# Patient Record
Sex: Male | Born: 1940 | Race: White | Hispanic: No | Marital: Married | Smoking: Former smoker
Health system: Southern US, Community
[De-identification: ages and names within clinical notes are randomized; demographics above are authoritative.]

## PROBLEM LIST (undated history)

## (undated) DIAGNOSIS — N529 Male erectile dysfunction, unspecified: Secondary | ICD-10-CM

## (undated) DIAGNOSIS — E785 Hyperlipidemia, unspecified: Secondary | ICD-10-CM

## (undated) DIAGNOSIS — Z8679 Personal history of other diseases of the circulatory system: Secondary | ICD-10-CM

## (undated) DIAGNOSIS — R7303 Prediabetes: Secondary | ICD-10-CM

## (undated) DIAGNOSIS — Z86718 Personal history of other venous thrombosis and embolism: Secondary | ICD-10-CM

## (undated) DIAGNOSIS — C61 Malignant neoplasm of prostate: Secondary | ICD-10-CM

## (undated) DIAGNOSIS — R300 Dysuria: Secondary | ICD-10-CM

## (undated) DIAGNOSIS — N210 Calculus in bladder: Secondary | ICD-10-CM

## (undated) DIAGNOSIS — R911 Solitary pulmonary nodule: Secondary | ICD-10-CM

## (undated) DIAGNOSIS — Z87448 Personal history of other diseases of urinary system: Secondary | ICD-10-CM

## (undated) DIAGNOSIS — Z860100 Personal history of colon polyps, unspecified: Secondary | ICD-10-CM

## (undated) DIAGNOSIS — Z86711 Personal history of pulmonary embolism: Secondary | ICD-10-CM

## (undated) DIAGNOSIS — Z974 Presence of external hearing-aid: Secondary | ICD-10-CM

## (undated) DIAGNOSIS — Z923 Personal history of irradiation: Secondary | ICD-10-CM

## (undated) DIAGNOSIS — N400 Enlarged prostate without lower urinary tract symptoms: Secondary | ICD-10-CM

## (undated) DIAGNOSIS — Z8719 Personal history of other diseases of the digestive system: Secondary | ICD-10-CM

## (undated) DIAGNOSIS — R351 Nocturia: Secondary | ICD-10-CM

## (undated) DIAGNOSIS — I10 Essential (primary) hypertension: Secondary | ICD-10-CM

## (undated) DIAGNOSIS — N4289 Other specified disorders of prostate: Secondary | ICD-10-CM

## (undated) HISTORY — PX: HERNIA REPAIR: SHX51

## (undated) HISTORY — DX: Benign prostatic hyperplasia without lower urinary tract symptoms: N40.0

## (undated) HISTORY — DX: Male erectile dysfunction, unspecified: N52.9

## (undated) HISTORY — PX: EYE SURGERY: SHX253

## (undated) HISTORY — DX: Prediabetes: R73.03

## (undated) HISTORY — PX: COLONOSCOPY: SHX174

## (undated) HISTORY — PX: CATARACT EXTRACTION W/ INTRAOCULAR LENS  IMPLANT, BILATERAL: SHX1307

---
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
## 1998-05-12 ENCOUNTER — Ambulatory Visit (HOSPITAL_COMMUNITY): Admission: RE | Admit: 1998-05-12 | Discharge: 1998-05-12 | Payer: Self-pay | Admitting: Family Medicine

## 2009-03-12 DIAGNOSIS — Z8719 Personal history of other diseases of the digestive system: Secondary | ICD-10-CM

## 2009-03-12 HISTORY — DX: Personal history of other diseases of the digestive system: Z87.19

## 2009-03-21 ENCOUNTER — Ambulatory Visit (HOSPITAL_COMMUNITY): Admission: RE | Admit: 2009-03-21 | Discharge: 2009-03-21 | Payer: Self-pay | Admitting: General Surgery

## 2009-03-21 HISTORY — PX: OTHER SURGICAL HISTORY: SHX169

## 2009-03-22 ENCOUNTER — Emergency Department (HOSPITAL_COMMUNITY): Admission: EM | Admit: 2009-03-22 | Discharge: 2009-03-22 | Payer: Self-pay | Admitting: Emergency Medicine

## 2009-05-09 ENCOUNTER — Ambulatory Visit (HOSPITAL_BASED_OUTPATIENT_CLINIC_OR_DEPARTMENT_OTHER): Admission: RE | Admit: 2009-05-09 | Discharge: 2009-05-10 | Payer: Self-pay | Admitting: Urology

## 2009-05-09 HISTORY — PX: TRANSURETHRAL RESECTION OF PROSTATE: SHX73

## 2009-11-17 ENCOUNTER — Encounter: Admission: RE | Admit: 2009-11-17 | Discharge: 2009-11-17 | Payer: Self-pay | Admitting: General Surgery

## 2009-12-26 ENCOUNTER — Ambulatory Visit (HOSPITAL_COMMUNITY): Admission: RE | Admit: 2009-12-26 | Discharge: 2009-12-28 | Payer: Self-pay | Admitting: General Surgery

## 2009-12-26 HISTORY — PX: OTHER SURGICAL HISTORY: SHX169

## 2009-12-31 ENCOUNTER — Ambulatory Visit: Payer: Self-pay | Admitting: Internal Medicine

## 2009-12-31 ENCOUNTER — Inpatient Hospital Stay (HOSPITAL_COMMUNITY): Admission: EM | Admit: 2009-12-31 | Discharge: 2010-01-10 | Payer: Self-pay | Admitting: General Surgery

## 2010-01-05 ENCOUNTER — Ambulatory Visit: Payer: Self-pay | Admitting: Vascular Surgery

## 2010-01-05 ENCOUNTER — Encounter (INDEPENDENT_AMBULATORY_CARE_PROVIDER_SITE_OTHER): Payer: Self-pay | Admitting: Surgery

## 2010-01-05 HISTORY — PX: TRANSTHORACIC ECHOCARDIOGRAM: SHX275

## 2010-01-12 ENCOUNTER — Ambulatory Visit: Payer: Self-pay | Admitting: Internal Medicine

## 2010-01-12 LAB — CONVERTED CEMR LAB

## 2010-01-19 ENCOUNTER — Ambulatory Visit: Payer: Self-pay | Admitting: Cardiology

## 2010-01-19 LAB — CONVERTED CEMR LAB: POC INR: 5.1

## 2010-01-25 ENCOUNTER — Encounter: Payer: Self-pay | Admitting: Internal Medicine

## 2010-01-26 ENCOUNTER — Ambulatory Visit: Payer: Self-pay | Admitting: Internal Medicine

## 2010-01-26 DIAGNOSIS — I2699 Other pulmonary embolism without acute cor pulmonale: Secondary | ICD-10-CM

## 2010-01-26 DIAGNOSIS — I10 Essential (primary) hypertension: Secondary | ICD-10-CM | POA: Insufficient documentation

## 2010-01-27 ENCOUNTER — Ambulatory Visit: Payer: Self-pay | Admitting: Cardiology

## 2010-01-27 LAB — CONVERTED CEMR LAB: POC INR: 3.3

## 2010-02-03 ENCOUNTER — Ambulatory Visit: Payer: Self-pay | Admitting: Cardiology

## 2010-02-03 LAB — CONVERTED CEMR LAB: POC INR: 3.1

## 2010-04-11 NOTE — Medication Information (Signed)
Summary: rov/nb  Anticoagulant Therapy  Managed by: Lyna Poser, PharmD Referring MD: Sandria Manly PCP: Dr. Sigmund Hazel, Salley Scarlet Supervising MD: Antoine Poche MD, Fayrene Fearing Indication 1: Pulmonary Embolism Indication 2: Atrial Fibrillation Lab Used: [facility] [place] Site: [address] INR POC 3.3 INR RANGE 2-3  Dietary changes: no    Health status changes: no    Bleeding/hemorrhagic complications: no    Recent/future hospitalizations: no    Any changes in medication regimen? no    Recent/future dental: no  Any missed doses?: no       Is patient compliant with meds? yes       Allergies: 1)  ! * Pseudophedrine  Anticoagulation Management History:      Positive risk factors for bleeding include an age of 71 years or older.  The bleeding index is 'intermediate risk'.  Positive CHADS2 values include History of HTN.  Negative CHADS2 values include Age > 7 years old.  Anticoagulation responsible provider: Antoine Poche MD, Fayrene Fearing.  INR POC: 3.3.  Exp: 01/2011.    Anticoagulation Management Assessment/Plan:      The patient's current anticoagulation dose is Coumadin 2.5 mg tabs: Take 3 tablets daily except on Tuesday and Saturday take 2 tablets, per coumadin clinic.  The target INR is 2.0-3.0.  The next INR is due 02/03/2010.  Results were reviewed/authorized by Lyna Poser, PharmD.         Prior Anticoagulation Instructions: INR 5.1 Hold dose today and tomorrow, take 3 tablets everyday except 2 tablets on Tuesday and Saturday. Recheck INR in 1 weeks.  Current Anticoagulation Instructions: INR 3.3 Skip your dose today. On tuesday, thursday, and saturday take 2 tablets. And take 3 tablets all other days. Recheck in 1 week.

## 2010-04-11 NOTE — Medication Information (Signed)
Summary: NEW TO COUMADIN/PE/AFIB  Anticoagulant Therapy  Managed by: Lyna Poser, PharmD Referring MD: Lucienne Minks MD: Daleen Squibb MD, Maisie Fus Indication 1: Pulmonary Embolism Indication 2: Atrial Fibrillation Lab Used: [facility] [place] Site: [address] INR POC 2.3 INR RANGE 2-3  Dietary changes: no    Health status changes: yes       Details: recently d/c with new PE and afib  Bleeding/hemorrhagic complications: no    Recent/future hospitalizations: yes       Details: recently d/c'd  Any changes in medication regimen? yes       Details: started amiodarone as well in hospital  Recent/future dental: no  Any missed doses?: no       Is patient compliant with meds? yes       Anticoagulation Management History:      The patient comes in today for his initial visit for anticoagulation therapy.  Positive risk factors for bleeding include an age of 62 years or older.  The bleeding index is 'intermediate risk'.  Negative CHADS2 values include Age > 8 years old.  Anticoagulation responsible provider: Daleen Squibb MD, Maisie Fus.  INR POC: 2.3.  Cuvette Lot#: [id number].    Anticoagulation Management Assessment/Plan:      The next INR is due 01/19/2010.  Results were reviewed/authorized by Lyna Poser, PharmD.         Current Anticoagulation Instructions: INR 2.3  Continue taking 3 tablets at bedtime. Recheck in 1 week.

## 2010-04-11 NOTE — Medication Information (Signed)
Summary: rov/mw  Anticoagulant Therapy  Managed by: Weston Brass, PharmD Referring MD: Lucienne Minks MD: Antoine Poche MD, Fayrene Fearing Indication 1: Pulmonary Embolism Indication 2: Atrial Fibrillation Lab Used: [facility] [place] Site: [address] INR POC 5.1 INR RANGE 2-3  Dietary changes: no    Health status changes: no    Bleeding/hemorrhagic complications: no    Recent/future hospitalizations: no    Any changes in medication regimen? no    Recent/future dental: no  Any missed doses?: no       Is patient compliant with meds? yes      Comments: Pt is new to coumadin and amiodarone; he is likely to need a reduced dose at next visit. Recommend continued f/u every week. Pt got 7.5 mg x4 while in the hospital, INR at discharge was 2. Pt recieved 7.5 mg from 11/2 to 11/10, INR was 5.1 on 11/10.   Anticoagulation Management History:      The patient is taking warfarin and comes in today for a routine follow up visit.  Positive risk factors for bleeding include an age of 71 years or older.  The bleeding index is 'intermediate risk'.  Negative CHADS2 values include Age > 41 years old.  Anticoagulation responsible Kazi Reppond: Antoine Poche MD, Fayrene Fearing.  INR POC: 5.1.  Cuvette Lot#: [id number].  Exp: 01/2011.    Anticoagulation Management Assessment/Plan:      The target INR is 2.0-3.0.  The next INR is due 01/27/2010.  Results were reviewed/authorized by Weston Brass, PharmD.  He was notified by Hoy Register, PharmD Candidate.         Prior Anticoagulation Instructions: INR 2.3  Continue taking 3 tablets at bedtime. Recheck in 1 week.   Current Anticoagulation Instructions: INR 5.1 Hold dose today and tomorrow, take 3 tablets everyday except 2 tablets on Tuesday and Saturday. Recheck INR in 1 weeks.

## 2010-04-11 NOTE — Assessment & Plan Note (Signed)
Summary: Robert Miller   Visit Type:  Hospital Follow-up Copy to:  Dr Violeta Gelinas Primary Provider/Referring Provider:  Dr. Sigmund Hazel, Salley Scarlet  CC:  HFU for PE.  History of Present Illness: January 26, 2010: 70 year old male. REmote limited smoker. Previously healthy other than high lipid being on gemfibrosil. Was admitted in [facility] for verntal hernia repair. Post discharge admited  for partial SBO 12/31/2009. One day prior to anticipated discharge and on 10/126/2011 developed PE with Right Posterior Tibial Vein DVT and related A Fib. Discharged 01/11/2010 on amiodarone and coumadin. Per Dr. Janee Morn patient needed very high doses of heparin for anticoagulation and felt that in retrospect standard dvt prophylaxis with subcutaneously heparin was not sufficient. Currently feels well and back to baseline. Denies dyspnea, cough, chest pain, fever, chills, nausea, vomit, diarrhea, edema,  Wants to come off amiodarone and coumadin asap.   Denies prior DVT or PE. Denies family hx of DVT. No other PE/DVT risk factors.   Preventive Screening-Counseling & Management  Alcohol-Tobacco     Smoking Status: quit     Packs/Day: 0.5     Year Started: 1960     Year Quit: 1967     Pack years: 3.5  Current Medications (verified): 1)  Gemfibrozil 600 Mg Tabs (Gemfibrozil) .... Take One Tablet By Mouth Twice A Day With Meals 2)  Amiodarone Hcl 200 Mg Tabs (Amiodarone Hcl) .... Take One Tablet By Mouth Twice A Day 3)  Coumadin 2.5 Mg Tabs (Warfarin Sodium) .... Take 3 Tablets Daily Except On Tuesday and Saturday Take 2 Tablets, Per Coumadin Clinic  Allergies (verified): 1)  ! * Pseudophedrine  Past History:  Past Medical History: Postoperative partial small-bowel obstruction. 2. Acute bilateral pulmonary emboli with Posterior Tibial Vein DVT 01/04/2010 post op 3. Atrial fibrillation with rapid ventricular response (resolved).  Past Surgical History: Appendectomy Hernia Repair  Family  History: Father-dies 52 of MI, heavy drinker, HTN Mother-dies 76 stroke, HTN Siste-r died 2 suddenly from aneurysm  Social History: Patient states former smoker.  Started in 1960, quit in 1967 1/2 ppd.  Married Retired wife disabled x at age 47 yearrs after cardiac arrest hypoxia a male friend helps him and her with medical needs Smoking Status:  quit Packs/Day:  0.5 Pack years:  3.5  Review of Systems  The patient denies shortness of breath with activity, shortness of breath at rest, productive cough, non-productive cough, coughing up blood, chest pain, irregular heartbeats, acid heartburn, indigestion, loss of appetite, weight change, abdominal pain, difficulty swallowing, sore throat, tooth/dental problems, headaches, nasal congestion/difficulty breathing through nose, sneezing, itching, ear ache, anxiety, depression, hand/feet swelling, joint stiffness or pain, rash, change in color of mucus, and fever.    Vital Signs:  Patient profile:   70 year old male Height:      72 inches Weight:      229.25 pounds BMI:     31.20 O2 Sat:      96 % on Room air Temp:     98.6 degrees F oral Pulse rate:   75 / minute BP sitting:   130 / 82  (right arm) Cuff size:   regular  Vitals Entered By: Carron Curie CMA (January 26, 2010 2:55 PM)  O2 Flow:  Room air CC: HFU for PE Comments Medications reviewed with patient Carron Curie CMA  January 26, 2010 2:59 PM Daytime phone number verified with patient.    Physical Exam  General:  well developed, well nourished, in no acute distress.  Overweight Head:  normocephalic and atraumatic Eyes:  PERRLA/EOM intact; conjunctiva and sclera clear Ears:  TMs intact and clear with normal canals Nose:  no deformity, discharge, inflammation, or lesions Mouth:  no deformity or lesions Neck:  no masses, thyromegaly, or abnormal cervical nodes Chest Wall:  no deformities noted Lungs:  clear bilaterally to auscultation and  percussion Heart:  regular rate and rhythm, S1, S2 without murmurs, rubs, gallops, or clicks Abdomen:  soft obese scar of surgery + one echhymoses from old heparin shot + non tender normal bowel sounds + Msk:  no deformity or scoliosis noted with normal posture Pulses:  pulses normal Extremities:  no clubbing, cyanosis, edema, or deformity noted Neurologic:  CN II-XII grossly intact with normal reflexes, coordination, muscle strength and tone Skin:  intact without lesions or rashes Cervical Nodes:  no significant adenopathy Axillary Nodes:  no significant adenopathy Psych:  alert and cooperative; normal mood and affect; normal attention span and concentration   CT of Chest  Procedure date:  01/04/2010  Findings:       Contrast:  100 ml intravenous Omnipaque-300    Comparison:  None    Findings:  This is a technically satisfactory study.    Pulmonary emboli are identified within both main pulmonary arteries   and multiple segmental arteries bilaterally.   Evidence of right heart strain is noted.    There is no evidence of thoracic aortic aneurysm/dissection.   Mild to moderate coronary artery calcifications are present.    Tiny bilateral pleural effusions are noted.   There is no evidence of pericardial effusion.    No enlarged lymph nodes are identified.    Scattered areas of airspace opacity and focal consolidation are   noted bilaterally, greatest in the lower lobes and compatible   atelectasis and infarct changes.   There is no evidence of pulmonary nodule or mass.    The visualized upper abdomen is within normal limits.    No acute or suspicious bony abnormalities are noted.    Review of the MIP images confirms the above findings.    IMPRESSION:   Bilateral pulmonary emboli with moderate to large clot burden and   evidence of right heart strain.    Tiny bilateral pleural effusions and bilateral pulmonary opacities   likely representing atelectasis and  infarct changes.    Coronary artery disease.    Read By:  Rosendo Gros,  M.D.  Impression & Recommendations:  Problem # 1:  PULMONARY EMBOLISM (ICD-415.19) Assessment Unchanged  happened in setting of post op admission. No other risk factors. Currently doing well  PLAN explained need to take coumadin minimum 3 months but optimal 6 months life long coumadin if recurs therefore prefer he take coumadin now for 6 months esp so becuae risk for recurrence is greatest first 2 years he needs to sort out with Dr. Garnette Scheuermann about length of coumadin and amio for A Fib which is clearly related to PE and he is now sinus post coumadin he needs lovenox for any trip >4-5h  any futyure hospitalizations needs lovenox for dvt proph and SCDS  His updated medication list for this problem includes:    Coumadin 2.5 Mg Tabs (Warfarin sodium) .Marland Kitchen... Take 3 tablets daily except on tuesday and saturday take 2 tablets, per coumadin clinic  Orders: Est. Patient Level III (04540)  Medications Added to Medication List This Visit: 1)  Coumadin 2.5 Mg Tabs (Warfarin sodium) .... Take 3 tablets daily except on tuesday and  saturday take 2 tablets, per coumadin clinic  Patient Instructions: 1)  you need coumadin for 6 months atleast in total from clot in lung stand point 2)  talk to Dr Garnette Scheuermann about how long amiodarone and coumadinf from A Fib stand point 3)  return to see me in 3 months 4)  once you finish coumadin and when you take long (>4-5h) car or plane trips - you need lovenox preventivee shots 5)  return to see me in 3 months 6)  avoid injury and falls   Immunization History:  Influenza Immunization History:    Influenza:  historical (12/12/2009)  Pneumovax Immunization History:    Pneumovax:  historical (12/12/2009)

## 2010-04-11 NOTE — Medication Information (Signed)
Summary: rov/mw  Anticoagulant Therapy  Managed by: Weston Brass, PharmD Referring MD: Sandria Manly PCP: Dr. Sigmund Hazel, Salley Scarlet Supervising MD: Myrtis Ser MD, Tinnie Gens Indication 1: Pulmonary Embolism Indication 2: Atrial Fibrillation Lab Used: [facility] [place] Site: [address] INR POC 3.1 INR RANGE 2-3  Dietary changes: no    Health status changes: no    Bleeding/hemorrhagic complications: no    Recent/future hospitalizations: no    Any changes in medication regimen? yes       Details: amiodarone decreased from 400mg  BID to 200mg  daily  Recent/future dental: no  Any missed doses?: no       Is patient compliant with meds? yes       Allergies: 1)  ! * Pseudophedrine  Anticoagulation Management History:      The patient is taking warfarin and comes in today for a routine follow up visit.  Positive risk factors for bleeding include an age of 71 years or older.  The bleeding index is 'intermediate risk'.  Positive CHADS2 values include History of HTN.  Negative CHADS2 values include Age > 12 years old.  Anticoagulation responsible provider: Myrtis Ser MD, Tinnie Gens.  INR POC: 3.1.  Cuvette Lot#: [id number].  Exp: 02/2011.    Anticoagulation Management Assessment/Plan:      The patient's current anticoagulation dose is Coumadin 2.5 mg tabs: Take 3 tablets daily except on Tuesday and Saturday take 2 tablets, per coumadin clinic.  The target INR is 2.0-3.0.  The next INR is due 02/17/2010.  Results were reviewed/authorized by Weston Brass, PharmD.  He was notified by Weston Brass PharmD.         Prior Anticoagulation Instructions: INR 3.3 Skip your dose today. On tuesday, thursday, and saturday take 2 tablets. And take 3 tablets all other days. Recheck in 1 week.   Current Anticoagulation Instructions: INR 3.1  Decrease dose to 2 tablets every day except 3 tablets on Monday, Wednesday and Friday.  Recheck INR in 2 weeks.

## 2010-04-13 NOTE — Letter (Signed)
Summary: [facility]   Imported By: Lennie Odor 02/22/2010 14:33:21  _____________________________________________________________________  External Attachment:    Type:   Image     Comment:   External Document

## 2010-05-01 ENCOUNTER — Encounter: Payer: Self-pay | Admitting: Cardiovascular Disease

## 2010-05-09 NOTE — Medication Information (Signed)
Summary: Coumadin Clinic  Anticoagulant Therapy  Managed by: Inactive Referring MD: Ramaswany PCP: Dr. Sigmund Hazel, Salley Scarlet Supervising MD: Myrtis Ser MD, Tinnie Gens Indication 1: Pulmonary Embolism Indication 2: Atrial Fibrillation Lab Used: [facility] [place] Site: [address] INR RANGE 2-3          Comments: Pt's INR followed by Dr. Sigmund Hazel  Allergies: 1)  ! * Pseudophedrine  Anticoagulation Management History:      Positive risk factors for bleeding include an age of 51 years or older.  The bleeding index is 'intermediate risk'.  Positive CHADS2 values include History of HTN.  Negative CHADS2 values include Age > 58 years old.  Anticoagulation responsible provider: Myrtis Ser MD, Tinnie Gens.  Exp: 02/2011.    Anticoagulation Management Assessment/Plan:      The patient's current anticoagulation dose is Coumadin 2.5 mg tabs: Take 3 tablets daily except on Tuesday and Saturday take 2 tablets, per coumadin clinic.  The target INR is 2.0-3.0.  The next INR is due 02/17/2010.  Results were reviewed/authorized by Inactive.         Prior Anticoagulation Instructions: INR 3.1  Decrease dose to 2 tablets every day except 3 tablets on Monday, Wednesday and Friday.  Recheck INR in 2 weeks.

## 2010-05-23 LAB — CBC
MCV: 82.1 fL (ref 78.0–100.0)
Platelets: 239 10*3/uL (ref 150–400)
RDW: 13.4 % (ref 11.5–15.5)
WBC: 8.9 10*3/uL (ref 4.0–10.5)

## 2010-05-23 LAB — PROTIME-INR: INR: 2 — ABNORMAL HIGH (ref 0.00–1.49)

## 2010-05-23 LAB — HEPARIN LEVEL (UNFRACTIONATED): Heparin Unfractionated: 0.48 IU/mL (ref 0.30–0.70)

## 2010-05-24 LAB — CBC
HCT: 35 % — ABNORMAL LOW (ref 39.0–52.0)
HCT: 37.8 % — ABNORMAL LOW (ref 39.0–52.0)
HCT: 38.6 % — ABNORMAL LOW (ref 39.0–52.0)
HCT: 39.5 % (ref 39.0–52.0)
HCT: 39.7 % (ref 39.0–52.0)
HCT: 40.2 % (ref 39.0–52.0)
Hemoglobin: 12 g/dL — ABNORMAL LOW (ref 13.0–17.0)
Hemoglobin: 12.8 g/dL — ABNORMAL LOW (ref 13.0–17.0)
Hemoglobin: 13 g/dL (ref 13.0–17.0)
Hemoglobin: 13.2 g/dL (ref 13.0–17.0)
Hemoglobin: 13.2 g/dL (ref 13.0–17.0)
Hemoglobin: 13.3 g/dL (ref 13.0–17.0)
MCH: 27.4 pg (ref 26.0–34.0)
MCH: 27.5 pg (ref 26.0–34.0)
MCH: 27.7 pg (ref 26.0–34.0)
MCH: 27.9 pg (ref 26.0–34.0)
MCH: 28.1 pg (ref 26.0–34.0)
MCH: 28.3 pg (ref 26.0–34.0)
MCH: 28.5 pg (ref 26.0–34.0)
MCHC: 33.2 g/dL (ref 30.0–36.0)
MCHC: 33.4 g/dL (ref 30.0–36.0)
MCHC: 33.8 g/dL (ref 30.0–36.0)
MCHC: 33.9 g/dL (ref 30.0–36.0)
MCHC: 34.3 g/dL (ref 30.0–36.0)
MCV: 81.1 fL (ref 78.0–100.0)
MCV: 82.5 fL (ref 78.0–100.0)
MCV: 83.1 fL (ref 78.0–100.0)
MCV: 83.4 fL (ref 78.0–100.0)
MCV: 83.5 fL (ref 78.0–100.0)
MCV: 84.1 fL (ref 78.0–100.0)
Platelets: 143 10*3/uL — ABNORMAL LOW (ref 150–400)
Platelets: 144 10*3/uL — ABNORMAL LOW (ref 150–400)
Platelets: 214 10*3/uL (ref 150–400)
RBC: 4.49 MIL/uL (ref 4.22–5.81)
RBC: 4.68 MIL/uL (ref 4.22–5.81)
RBC: 4.7 MIL/uL (ref 4.22–5.81)
RBC: 4.73 MIL/uL (ref 4.22–5.81)
RBC: 4.84 MIL/uL (ref 4.22–5.81)
RDW: 13.3 % (ref 11.5–15.5)
RDW: 13.3 % (ref 11.5–15.5)
RDW: 13.4 % (ref 11.5–15.5)
RDW: 13.4 % (ref 11.5–15.5)
WBC: 11.8 10*3/uL — ABNORMAL HIGH (ref 4.0–10.5)
WBC: 6.7 10*3/uL (ref 4.0–10.5)
WBC: 7.6 10*3/uL (ref 4.0–10.5)
WBC: 8.8 10*3/uL (ref 4.0–10.5)

## 2010-05-24 LAB — BASIC METABOLIC PANEL
BUN: 17 mg/dL (ref 6–23)
BUN: 29 mg/dL — ABNORMAL HIGH (ref 6–23)
BUN: 37 mg/dL — ABNORMAL HIGH (ref 6–23)
BUN: 50 mg/dL — ABNORMAL HIGH (ref 6–23)
CO2: 21 mEq/L (ref 19–32)
CO2: 28 mEq/L (ref 19–32)
Calcium: 7.8 mg/dL — ABNORMAL LOW (ref 8.4–10.5)
Calcium: 8.6 mg/dL (ref 8.4–10.5)
Chloride: 110 mEq/L (ref 96–112)
Chloride: 99 mEq/L (ref 96–112)
Creatinine, Ser: 0.94 mg/dL (ref 0.4–1.5)
Creatinine, Ser: 1.36 mg/dL (ref 0.4–1.5)
GFR calc Af Amer: >60 mL/min (ref >60)
GFR calc Af Amer: >60 mL/min (ref >60)
GFR calc Af Amer: >60 mL/min (ref >60)
GFR calc non Af Amer: >60 mL/min (ref >60)
GFR calc non Af Amer: >60 mL/min (ref >60)
GFR calc non Af Amer: >60 mL/min (ref >60)
Glucose, Bld: 131 mg/dL — ABNORMAL HIGH (ref 70–99)
Potassium: 3.7 mEq/L (ref 3.5–5.1)
Potassium: 3.7 mEq/L (ref 3.5–5.1)
Potassium: 4.1 mEq/L (ref 3.5–5.1)
Sodium: 141 mEq/L (ref 135–145)

## 2010-05-24 LAB — MRSA PCR SCREENING: MRSA by PCR: NEGATIVE

## 2010-05-24 LAB — GLUCOSE, CAPILLARY: Glucose-Capillary: 119 mg/dL — ABNORMAL HIGH (ref 70–99)

## 2010-05-24 LAB — BLOOD GAS, ARTERIAL
Acid-base deficit: 0.6 mmol/L (ref 0.0–2.0)
Drawn by: 32526
O2 Content: 4 L/min
pCO2 arterial: 28.1 mmHg — ABNORMAL LOW (ref 35.0–45.0)
pO2, Arterial: 56.2 mmHg — ABNORMAL LOW (ref 80.0–100.0)

## 2010-05-24 LAB — FACTOR 5 LEIDEN

## 2010-05-24 LAB — COMPREHENSIVE METABOLIC PANEL
ALT: 29 U/L (ref 0–53)
Albumin: 2.1 g/dL — ABNORMAL LOW (ref 3.5–5.2)
BUN: 16 mg/dL (ref 6–23)
BUN: 24 mg/dL — ABNORMAL HIGH (ref 6–23)
CO2: 25 mEq/L (ref 19–32)
Calcium: 7.8 mg/dL — ABNORMAL LOW (ref 8.4–10.5)
Calcium: 8 mg/dL — ABNORMAL LOW (ref 8.4–10.5)
Chloride: 101 mEq/L (ref 96–112)
Creatinine, Ser: 1.05 mg/dL (ref 0.4–1.5)
Creatinine, Ser: 1.14 mg/dL (ref 0.4–1.5)
GFR calc non Af Amer: >60 mL/min (ref >60)
GFR calc non Af Amer: >60 mL/min (ref >60)
Glucose, Bld: 122 mg/dL — ABNORMAL HIGH (ref 70–99)
Total Bilirubin: 0.9 mg/dL (ref 0.3–1.2)

## 2010-05-24 LAB — CARDIAC PANEL(CRET KIN+CKTOT+MB+TROPI)
CK, MB: 1.2 ng/mL (ref 0.3–4.0)
CK, MB: 1.3 ng/mL (ref 0.3–4.0)
Relative Index: INVALID (ref 0.0–2.5)
Relative Index: INVALID (ref 0.0–2.5)
Total CK: 29 U/L (ref 7–232)
Troponin I: 0.04 ng/mL (ref 0.00–0.06)

## 2010-05-24 LAB — PROTIME-INR
INR: 1.2 (ref 0.00–1.49)
INR: 1.47 (ref 0.00–1.49)
Prothrombin Time: 15.4 seconds — ABNORMAL HIGH (ref 11.6–15.2)
Prothrombin Time: 15.9 seconds — ABNORMAL HIGH (ref 11.6–15.2)
Prothrombin Time: 18 seconds — ABNORMAL HIGH (ref 11.6–15.2)

## 2010-05-24 LAB — MAGNESIUM: Magnesium: 2.1 mg/dL (ref 1.5–2.5)

## 2010-05-24 LAB — HEPARIN LEVEL (UNFRACTIONATED)
Heparin Unfractionated: 0.5 IU/mL (ref 0.30–0.70)
Heparin Unfractionated: 0.58 IU/mL (ref 0.30–0.70)
Heparin Unfractionated: 0.88 IU/mL — ABNORMAL HIGH (ref 0.30–0.70)
Heparin Unfractionated: <0.1 IU/mL — ABNORMAL LOW (ref 0.30–0.70)
Heparin Unfractionated: <0.1 IU/mL — ABNORMAL LOW (ref 0.30–0.70)

## 2010-05-24 LAB — TSH: TSH: 1.78 u[IU]/mL (ref 0.350–4.500)

## 2010-05-24 LAB — BRAIN NATRIURETIC PEPTIDE: Pro B Natriuretic peptide (BNP): 445 pg/mL — ABNORMAL HIGH (ref 0.0–100.0)

## 2010-05-24 LAB — PROTHROMBIN GENE MUTATION

## 2010-05-25 LAB — CBC
HCT: 41.2 % (ref 39.0–52.0)
Hemoglobin: 14.1 g/dL (ref 13.0–17.0)
MCH: 28.5 pg (ref 26.0–34.0)
MCV: 83.2 fL (ref 78.0–100.0)
RBC: 4.95 MIL/uL (ref 4.22–5.81)

## 2010-05-28 LAB — URINALYSIS, ROUTINE W REFLEX MICROSCOPIC
Bilirubin Urine: NEGATIVE
Specific Gravity, Urine: 1.02 (ref 1.005–1.030)
Urobilinogen, UA: 0.2 mg/dL (ref 0.0–1.0)

## 2010-05-28 LAB — CBC
HCT: 40.3 % (ref 39.0–52.0)
Hemoglobin: 13.9 g/dL (ref 13.0–17.0)
MCHC: 34.5 g/dL (ref 30.0–36.0)
RDW: 13.3 % (ref 11.5–15.5)

## 2010-05-28 LAB — BASIC METABOLIC PANEL
CO2: 28 mEq/L (ref 19–32)
Chloride: 106 mEq/L (ref 96–112)
GFR calc non Af Amer: 59 mL/min — ABNORMAL LOW (ref >60)
Glucose, Bld: 106 mg/dL — ABNORMAL HIGH (ref 70–99)
Potassium: 4.9 mEq/L (ref 3.5–5.1)
Sodium: 140 mEq/L (ref 135–145)

## 2010-05-28 LAB — URINE MICROSCOPIC-ADD ON

## 2010-05-28 LAB — URINE CULTURE: Colony Count: NO GROWTH

## 2010-06-01 LAB — HEMOGLOBIN AND HEMATOCRIT, BLOOD
HCT: 35.3 % — ABNORMAL LOW (ref 39.0–52.0)
Hemoglobin: 11.6 g/dL — ABNORMAL LOW (ref 13.0–17.0)

## 2010-06-05 LAB — HEMOGLOBIN AND HEMATOCRIT, BLOOD: Hemoglobin: 11.9 g/dL — ABNORMAL LOW (ref 13.0–17.0)

## 2010-12-18 IMAGING — CT CT ANGIO CHEST
2 of 7 series · 14 of 36 positions shown · IV contrast (100ml omni 300)
Comparison: None

CLINICAL DATA: Chest pain and shortness of breath.

CT ANGIOGRAPHY CHEST WITH CONTRAST
TECHNIQUE: Multidetector CT imaging of the chest was performed
using the standard protocol during bolus administration of
intravenous contrast.  Multiplanar CT image reconstructions
including MIPs were obtained to evaluate the vascular anatomy.
Contrast:  100 ml intravenous 9mnipaque-7VV

[Series 2: pe · axial · 0.76mm/px · z∈[-290,-39]mm · 13 of 235 slices shown]
[im 17/235  lung]
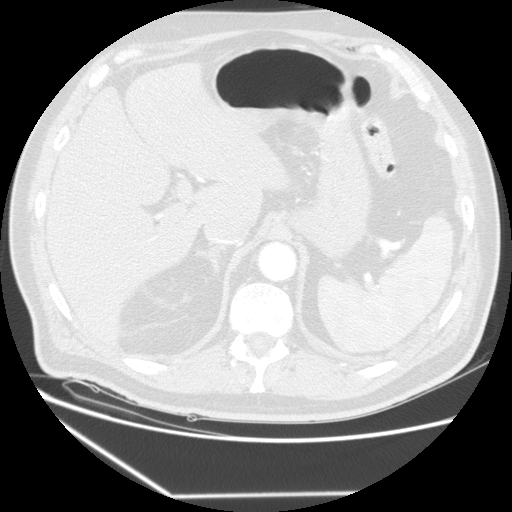
[im 34/235  mediastinal]
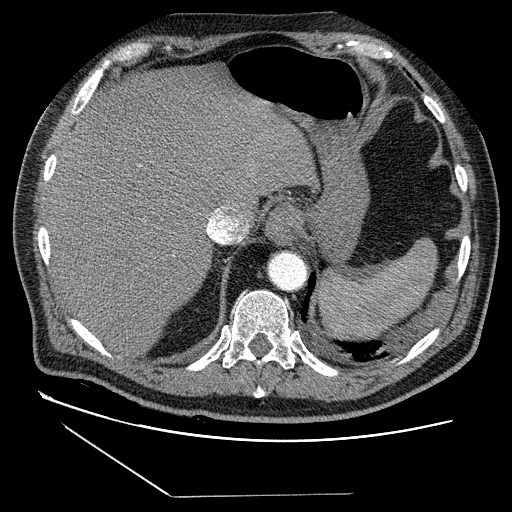
[im 51/235  lung]
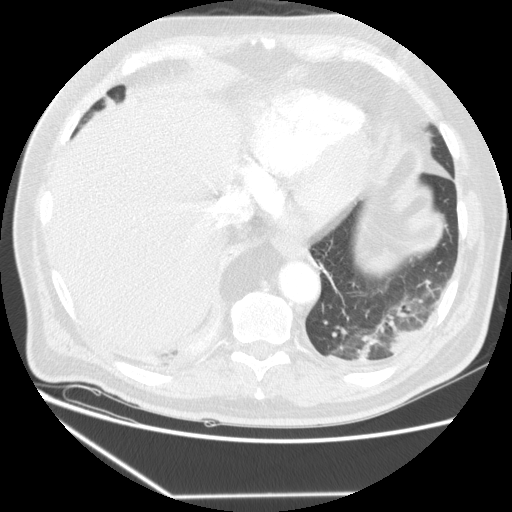
[im 67/235  mediastinal]
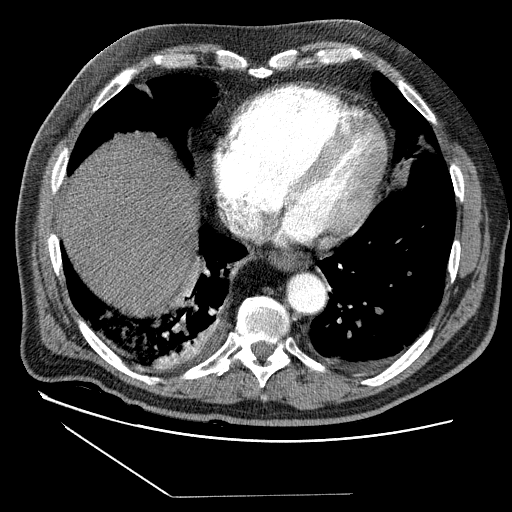
[im 84/235  lung]
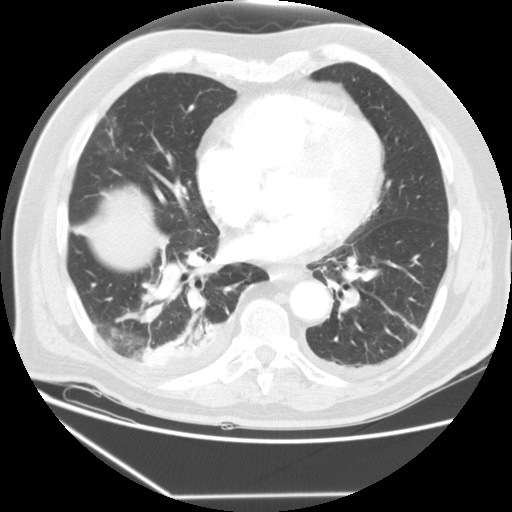
[im 101/235  mediastinal]
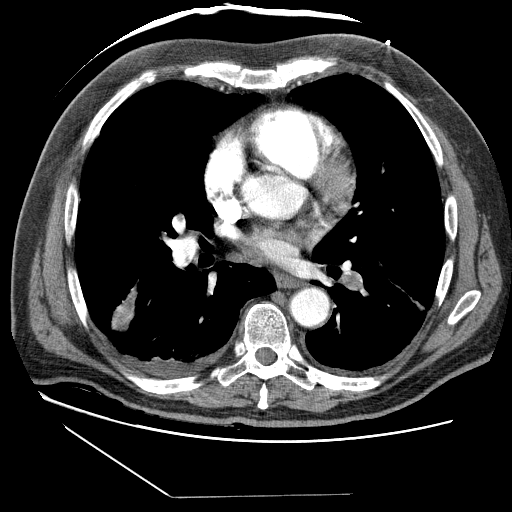
[im 118/235  lung]
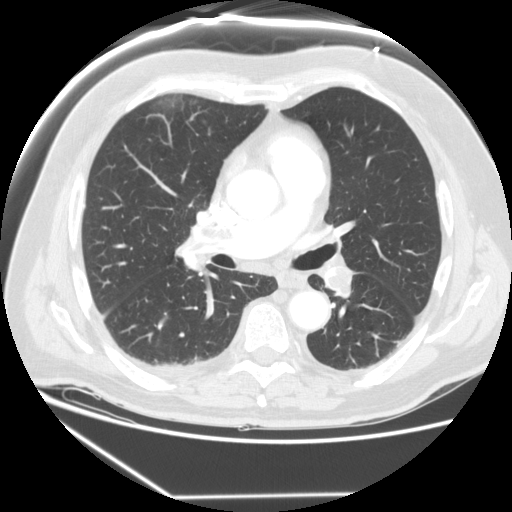
[im 134/235  mediastinal]
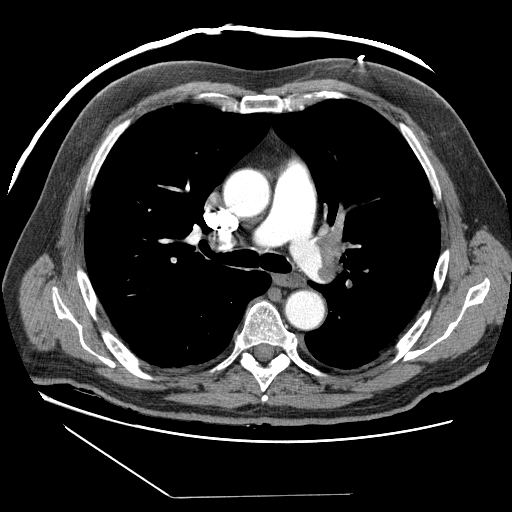
[im 151/235  lung]
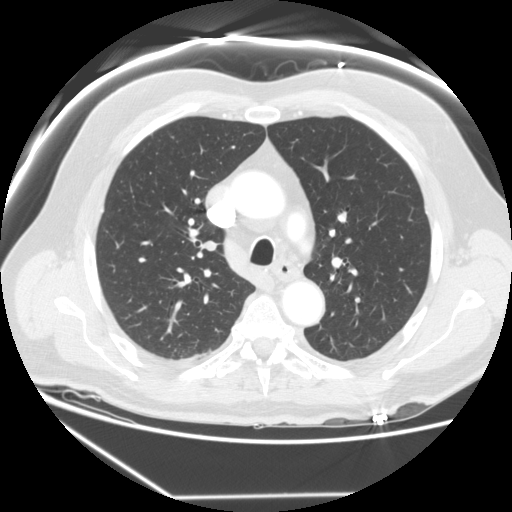
[im 168/235  mediastinal]
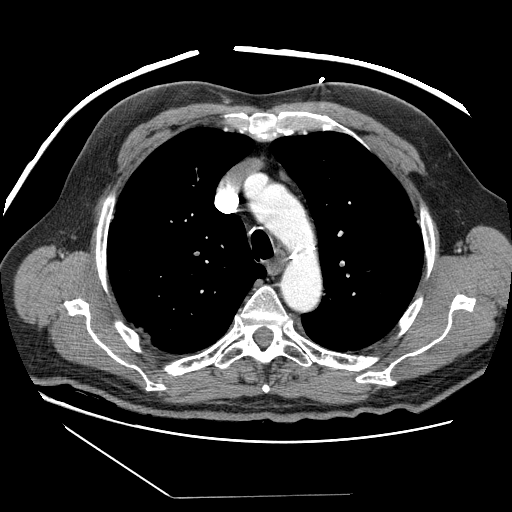
[im 184/235  lung]
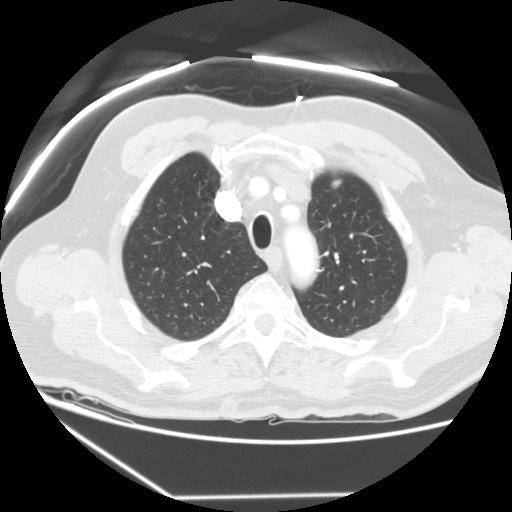
[im 201/235  mediastinal]
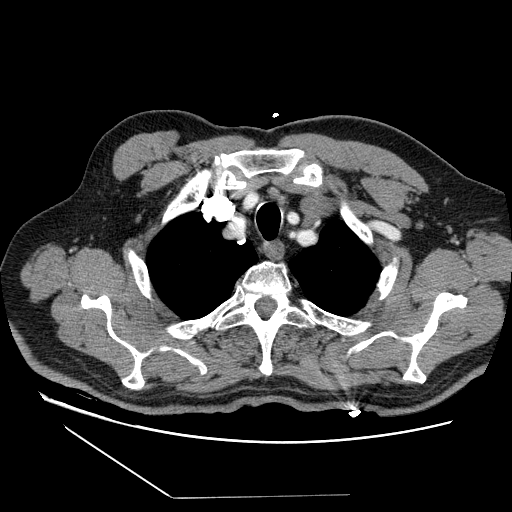
[im 218/235  lung]
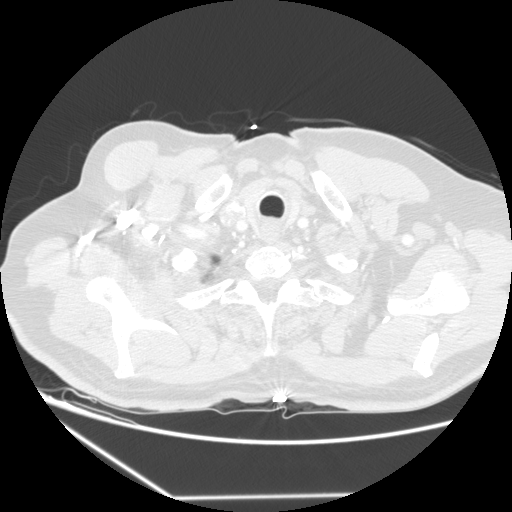

[Series 202: coronals · coronal · 0.76mm/px · 1 of 133 slices shown]
[im 67/133  mediastinal]
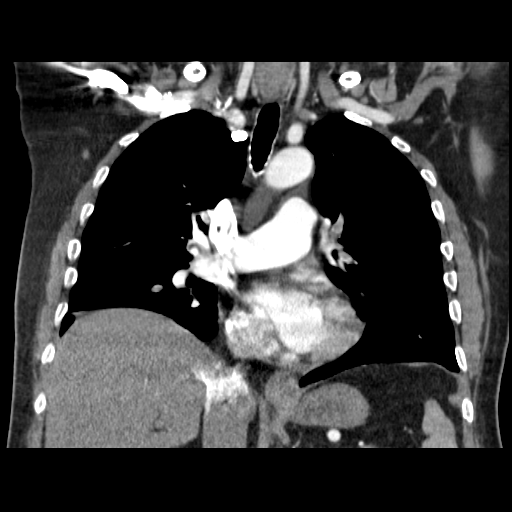

[14 of 36 positions shown; findings below may reference images not displayed]

FINDINGS: This is a technically satisfactory study.

Pulmonary emboli are identified within both main pulmonary arteries
and multiple segmental arteries bilaterally.
Evidence of right heart strain is noted.

There is no evidence of thoracic aortic aneurysm/dissection.
Mild to moderate coronary artery calcifications are present.

Tiny bilateral pleural effusions are noted.
There is no evidence of pericardial effusion.

No enlarged lymph nodes are identified.

Scattered areas of airspace opacity and focal consolidation are
noted bilaterally, greatest in the lower lobes and compatible
atelectasis and infarct changes.
There is no evidence of pulmonary nodule or mass.

The visualized upper abdomen is within normal limits.

No acute or suspicious bony abnormalities are noted.

Review of the MIP images confirms the above findings.
IMPRESSION: Bilateral pulmonary emboli with moderate to large clot burden and
evidence of right heart strain.

Tiny bilateral pleural effusions and bilateral pulmonary opacities
likely representing atelectasis and infarct changes.

Coronary artery disease.

## 2010-12-21 IMAGING — CR DG CHEST 1V PORT
1 series · 1 of 1 positions shown · non-contrast
Comparison: 01/07/2010

CLINICAL DATA: Right-sided PICC line placement.

PORTABLE CHEST - 1 VIEW

[view not recorded]
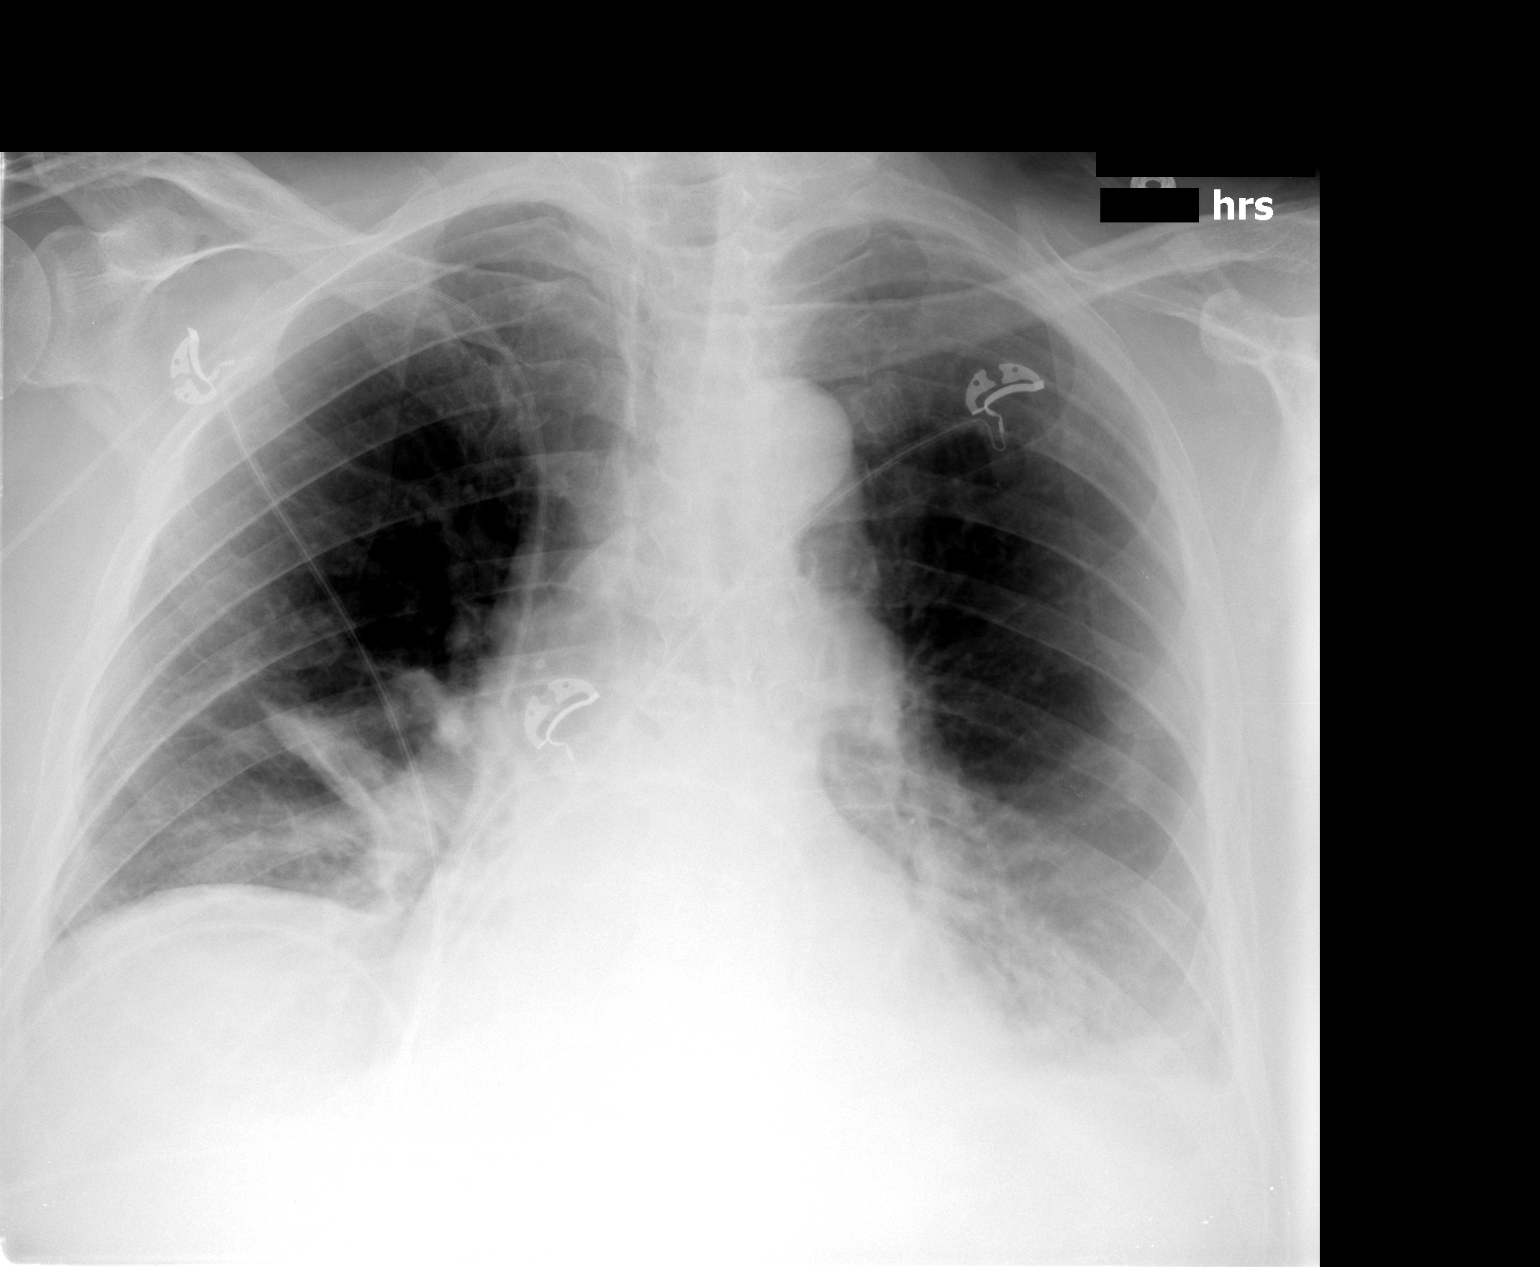

[1 of 1 positions shown; findings below may reference images not displayed]

FINDINGS: A right-sided PICC line is identified with tip overlying
the lower SVC.
This a low-volume film with bilateral lower lung
atelectasis/airspace disease.
The cardiomediastinal silhouette is stable.
There is no evidence of pneumothorax.
IMPRESSION: Right PICC line with tip overlying the lower SVC.

Continued bilateral lower lung atelectasis/airspace disease.

## 2011-01-17 ENCOUNTER — Encounter: Payer: Self-pay | Admitting: Radiation Oncology

## 2011-01-17 DIAGNOSIS — I2699 Other pulmonary embolism without acute cor pulmonale: Secondary | ICD-10-CM | POA: Insufficient documentation

## 2011-01-17 DIAGNOSIS — E7800 Pure hypercholesterolemia, unspecified: Secondary | ICD-10-CM | POA: Insufficient documentation

## 2011-01-18 ENCOUNTER — Ambulatory Visit
Admission: RE | Admit: 2011-01-18 | Discharge: 2011-01-18 | Disposition: A | Discharge status: Home / Self Care | Payer: Medicare Other | Source: Ambulatory Visit | Attending: Radiation Oncology | Admitting: Radiation Oncology

## 2011-01-18 ENCOUNTER — Encounter: Payer: Self-pay | Admitting: Radiation Oncology

## 2011-01-18 VITALS — BP 144/89 | HR 76 | Temp 98.7°F | Resp 18 | Ht 72.0 in | Wt 255.7 lb

## 2011-01-18 DIAGNOSIS — C61 Malignant neoplasm of prostate: Secondary | ICD-10-CM | POA: Insufficient documentation

## 2011-01-18 DIAGNOSIS — Z86718 Personal history of other venous thrombosis and embolism: Secondary | ICD-10-CM | POA: Insufficient documentation

## 2011-01-18 DIAGNOSIS — Z79899 Other long term (current) drug therapy: Secondary | ICD-10-CM | POA: Insufficient documentation

## 2011-01-18 DIAGNOSIS — E7800 Pure hypercholesterolemia, unspecified: Secondary | ICD-10-CM | POA: Insufficient documentation

## 2011-01-18 DIAGNOSIS — Z510 Encounter for antineoplastic radiation therapy: Secondary | ICD-10-CM | POA: Insufficient documentation

## 2011-01-18 HISTORY — DX: Malignant neoplasm of prostate: C61

## 2011-01-18 NOTE — Progress Notes (Signed)
Please see the Nurse Progress Note in the MD Initial Consult Encounter for this patient. 

## 2011-01-18 NOTE — Progress Notes (Signed)
[facility] Radiation Oncology NEW PATIENT EVALUATION  Name: Robert Miller MRN: [id number]  Date: 01/18/2011  DOB: 09/03/1940  Status:outpatient    CC:No primary provider on file.  Robert Miller, *    REFERRING PHYSICIAN: Jethro Bolus Miller, *   DIAGNOSIS: The encounter diagnosis was Prostate cancer. initial stage TI A. adenocarcinoma prostate.  HISTORY OF PRESENT ILLNESS::Robert Miller is a 70 y.o. 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 He underwent a TURP by Dr. Patsi Sears on 05/09/2009. 19 g of tissue was submitted. His son had Gleason 6 (3+3) involving less than 2% of the tissue. His PSA back in September of 2000 level was 3.45. A followup PSA on 11/15/2010 was 2.67. The watchful waiting protocol involved repeat prostate biopsies on 12/27/2010. He was found to have adenocarcinoma with a Gleason score of 7 (4+3) involving 25% of one core from the right lateral base. Remaining biopsies were benign except for high grade PIN along the right lateral apex. He is doing well from a GU and GI standpoint. His IPS test score is 6. His prostate gland size was 58 cc and prosthetic length 5.18 cm. He does have erectile dysfunction which can respond to Viagra.    PREVIOUS RADIATION THERAPY: No   PAST MEDICAL HISTORY:  has a past medical history of Hypercholesterolemia; Pulmonary embolism; and Prostate cancer.     PAST SURGICAL HISTORY: Past Surgical History  Procedure Date  . Appendectomy   . Transurethral resection of prostate   . Ventral hernia repair     X2     ETIOLOGIC FACTORS:   FAMILY HISTORY: family history includes Heart attack in his father and Stroke in his mother.   SOCIAL HISTORY:  reports that he quit smoking about 43 years ago. His smoking use included Cigarettes. He has a 5 pack-year smoking history. He does not have any smokeless tobacco history on  file. He reports that he does not drink alcohol or use illicit drugs.   ALLERGIES: Sudafed   MEDICATIONS:Current outpatient prescriptions:aspirin 81 MG chewable tablet, Chew 81 mg by mouth daily.  , Disp: , Rfl: ;  calcium-vitamin D (OSCAL WITH D) 500-200 MG-UNIT per tablet, Take 1 tablet by mouth daily.  , Disp: , Rfl: ;  co-enzyme Q-10 30 MG capsule, Take 30 mg by mouth 3 (three) times daily.  , Disp: , Rfl: ;  fish oil-omega-3 fatty acids 1000 MG capsule, Take 2 g by mouth daily.  , Disp: , Rfl:  gemfibrozil (LOPID) 600 MG tablet, Take 600 mg by mouth 2 (two) times daily before a meal.  , Disp: , Rfl:    REVIEW OF SYSTEMS:  Pertinent items are noted in HPI.    PHYSICAL EXAM:  height is 6' (1.829 m) and weight is 255 lb 11.2 oz (115.985 kg). His oral temperature is 98.7 F (37.1 C). His blood pressure is 144/89 and his pulse is 76. His respiration is 18. Alert and oriented 70 year old white male appearing his stated age. Head and neck examination grossly unremarkable. Nodes: Without cervical or supraclavicular lymphadenopathy. Chest: Lungs clear. Heart: Regular rate and rhythm. Back: Without spinal or CVA discomfort. Abdomen slight ventral hernia, without masses or organomegaly. Genitalia: Unremarkable to inspection. Rectal prostate gland is slightly enlarged and is without focal induration or nodularity. Extremities: Without edema. Neurologic examination: Grossly nonfocal.    LABORATORY DATA:  PSA 2.67  IMPRESSION: Initial stage TI A. adenocarcinoma of the prostate. Followup biopsies revealed Gleason 7 (4+3) adenocarcinoma of the prostate which places him in the intermediate risk category. Miller explained to the patient that his prognosis is related to his stage, Gleason score, and PSA level. His management options include 5 weeks of external beam radiation therapy followed by prostate seed implantation boost or 8 weeks of external beam/IM RT. There would be potential technical  limitations for seed implantation based on his previous TURP and his current large prostate gland size. He could undergo downsizing, but he may not be an ideal candidate for seed implant boost based on his TURP defect. After a lengthy discussion he is most interested in external beam/IRT which Miller think would be a good choice for him. Miller discussed the potential acute and late toxicities of radiation therapy he wishes to proceed as outlined. Miller will, we asked Dr. Patsi Sears to place 3 gold seed markers within the prostate for prostate imaging during his IRT. Consent was signed today.  PLAN: As above.  Miller spent 60 minutes minutes face to face with the patient and more than 50% of that time was spent in counseling and/or coordination of care.

## 2011-01-18 NOTE — Progress Notes (Deleted)
Please see the Nurse Progress Note in the MD Initial Consult Encounter for this patient. 

## 2011-01-18 NOTE — Progress Notes (Signed)
PATIENT HAS INVALID WIFE AT HOME THAT HE PROVIDES CARE FOR, VERY CONCERNED ABOUT TX AND RECOVERY

## 2011-01-19 NOTE — Progress Notes (Signed)
Encounter addended by: Amanda Pea, RN on: 01/19/2011  4:40 PM<BR>     Documentation filed: Charges VN

## 2011-01-22 ENCOUNTER — Telehealth: Payer: Self-pay | Admitting: *Deleted

## 2011-01-23 ENCOUNTER — Telehealth: Payer: Self-pay | Admitting: *Deleted

## 2011-01-24 ENCOUNTER — Other Ambulatory Visit: Payer: Self-pay | Admitting: Radiation Oncology

## 2011-02-12 ENCOUNTER — Ambulatory Visit
Admission: RE | Admit: 2011-02-12 | Discharge: 2011-02-12 | Disposition: A | Discharge status: Home / Self Care | Payer: Medicare Other | Source: Ambulatory Visit | Attending: Radiation Oncology | Admitting: Radiation Oncology

## 2011-02-12 DIAGNOSIS — C61 Malignant neoplasm of prostate: Secondary | ICD-10-CM

## 2011-02-12 NOTE — Progress Notes (Signed)
Simulation/Treatment Planning Note:    The patient was taken to the CT simulator. He was placed supine. An alpha cradle was constructed for immobilization. A red rubber tube was placed within the rectal vault. He was then catheterized and a urethrogram was performed. I chose an isocenter within the prostate. I then contoured his prostate and seminal vesicles. Dosimetry and contoured his rectum, bladder, and femoral heads. I'm prescribing 7800 cGy in 40 sessions utilizing 6 MV photons/IMRT. He is now ready for IMRT simulation/treatment planning.

## 2011-02-12 NOTE — Progress Notes (Signed)
Met with patient to discuss RO billing. °

## 2011-02-14 ENCOUNTER — Encounter: Payer: Self-pay | Admitting: Radiation Oncology

## 2011-02-14 NOTE — Progress Notes (Signed)
Chart note:  Mr. Robert Miller called me to tell me that he had gross hematuria this morning which is slowly clearing. No dysuria. No fever.   Impression: I suspect that Mr. Robert Miller had a traumatic catheterization at the time of his CT simulation on Monday.. I instructed him to drink plenty of fluids today. I gave him my voicemail and he will call me tomorrow to give me an update on his progress.

## 2011-02-21 ENCOUNTER — Ambulatory Visit
Admission: RE | Admit: 2011-02-21 | Discharge: 2011-02-21 | Disposition: A | Discharge status: Home / Self Care | Payer: Medicare Other | Source: Ambulatory Visit | Attending: Radiation Oncology | Admitting: Radiation Oncology

## 2011-02-22 ENCOUNTER — Ambulatory Visit
Admission: RE | Admit: 2011-02-22 | Discharge: 2011-02-22 | Disposition: A | Discharge status: Home / Self Care | Payer: Medicare Other | Source: Ambulatory Visit | Attending: Radiation Oncology | Admitting: Radiation Oncology

## 2011-02-23 ENCOUNTER — Ambulatory Visit
Admission: RE | Admit: 2011-02-23 | Discharge: 2011-02-23 | Disposition: A | Discharge status: Home / Self Care | Payer: Medicare Other | Source: Ambulatory Visit | Attending: Radiation Oncology | Admitting: Radiation Oncology

## 2011-02-26 ENCOUNTER — Ambulatory Visit
Admission: RE | Admit: 2011-02-26 | Discharge: 2011-02-26 | Disposition: A | Discharge status: Home / Self Care | Payer: Medicare Other | Source: Ambulatory Visit | Attending: Radiation Oncology | Admitting: Radiation Oncology

## 2011-02-26 VITALS — Wt 256.6 lb

## 2011-02-26 DIAGNOSIS — C61 Malignant neoplasm of prostate: Secondary | ICD-10-CM

## 2011-02-26 NOTE — Progress Notes (Signed)
Post sim done, gave pt "Radiation and You" booklet. No c/o today.

## 2011-02-26 NOTE — Progress Notes (Signed)
Weekly Management Note:  Site:prostate Current Dose:  780  cGy Projected Dose: 7800  cGy  Narrative: The patient is seen today for routine under treatment assessment. CBCT/MVCT images/port films were reviewed. The chart was reviewed.   He is without GU or GI difficulties. No further hematuria.  Physical Examination: There were no vitals filed for this visit..  Weight: 256 lb 9.6 oz (116.393 kg). No change.  Impression: Tolerating radiation therapy well.  Plan: Continue radiation therapy as planned.

## 2011-02-27 ENCOUNTER — Ambulatory Visit
Admission: RE | Admit: 2011-02-27 | Discharge: 2011-02-27 | Disposition: A | Discharge status: Home / Self Care | Payer: Medicare Other | Source: Ambulatory Visit | Attending: Radiation Oncology | Admitting: Radiation Oncology

## 2011-02-28 ENCOUNTER — Ambulatory Visit: Payer: Medicare Other

## 2011-02-28 ENCOUNTER — Ambulatory Visit
Admission: RE | Admit: 2011-02-28 | Discharge: 2011-02-28 | Disposition: A | Discharge status: Home / Self Care | Payer: Medicare Other | Source: Ambulatory Visit | Attending: Radiation Oncology | Admitting: Radiation Oncology

## 2011-03-01 ENCOUNTER — Ambulatory Visit
Admission: RE | Admit: 2011-03-01 | Discharge: 2011-03-01 | Disposition: A | Discharge status: Home / Self Care | Payer: Medicare Other | Source: Ambulatory Visit | Attending: Radiation Oncology | Admitting: Radiation Oncology

## 2011-03-02 ENCOUNTER — Ambulatory Visit
Admission: RE | Admit: 2011-03-02 | Discharge: 2011-03-02 | Disposition: A | Discharge status: Home / Self Care | Payer: Medicare Other | Source: Ambulatory Visit | Attending: Radiation Oncology | Admitting: Radiation Oncology

## 2011-03-05 ENCOUNTER — Ambulatory Visit
Admission: RE | Admit: 2011-03-05 | Discharge: 2011-03-05 | Disposition: A | Discharge status: Home / Self Care | Payer: Medicare Other | Source: Ambulatory Visit | Attending: Radiation Oncology | Admitting: Radiation Oncology

## 2011-03-05 VITALS — Wt 251.0 lb

## 2011-03-05 DIAGNOSIS — C61 Malignant neoplasm of prostate: Secondary | ICD-10-CM

## 2011-03-05 NOTE — Progress Notes (Signed)
   Weekly Management Note Current Dose:  1755 cGy  Projected Dose: 7800 cGy   Narrative:  The patient presents for routine under treatment assessment.  CBCT/MVCT images/Port film x-rays were reviewed.  The chart was checked. He has had some urinary frequency, but no burning or bleeding noted. Had a transient episode of diarrhea following [place] buffet. No fevers.  Physical Findings: Weight: 251 lb (113.853 kg).  No distress noted.  Impression:  The patient is tolerating radiotherapy.  Plan:  Continue radiotherapy as planned.

## 2011-03-05 NOTE — Progress Notes (Signed)
NO C/O 

## 2011-03-07 ENCOUNTER — Ambulatory Visit
Admission: RE | Admit: 2011-03-07 | Discharge: 2011-03-07 | Disposition: A | Discharge status: Home / Self Care | Payer: Medicare Other | Source: Ambulatory Visit | Attending: Radiation Oncology | Admitting: Radiation Oncology

## 2011-03-08 ENCOUNTER — Ambulatory Visit
Admission: RE | Admit: 2011-03-08 | Discharge: 2011-03-08 | Disposition: A | Discharge status: Home / Self Care | Payer: Medicare Other | Source: Ambulatory Visit | Attending: Radiation Oncology | Admitting: Radiation Oncology

## 2011-03-09 ENCOUNTER — Ambulatory Visit
Admission: RE | Admit: 2011-03-09 | Discharge: 2011-03-09 | Disposition: A | Discharge status: Home / Self Care | Payer: Medicare Other | Source: Ambulatory Visit | Attending: Radiation Oncology | Admitting: Radiation Oncology

## 2011-03-12 ENCOUNTER — Ambulatory Visit
Admission: RE | Admit: 2011-03-12 | Discharge: 2011-03-12 | Disposition: A | Discharge status: Home / Self Care | Payer: Medicare Other | Source: Ambulatory Visit | Attending: Radiation Oncology | Admitting: Radiation Oncology

## 2011-03-12 VITALS — Wt 251.0 lb

## 2011-03-12 DIAGNOSIS — C61 Malignant neoplasm of prostate: Secondary | ICD-10-CM

## 2011-03-12 NOTE — Progress Notes (Signed)
SAYS HE HAD URGENCY AND PAIN Thursday, TOOK SOME CRANBERRY PILLS AND IT EASED OFF.  NO PAIN OR URGENCY TODAY

## 2011-03-12 NOTE — Progress Notes (Signed)
Weekly Management Note:  Site:Prostate Current Dose:  2535  cGy Projected Dose: 7800  cGy  Narrative: The patient is seen today for routine under treatment assessment. CBCT/MVCT images/port films were reviewed. The chart was reviewed. He has good bladder filling.  He does report increasing urinary urgency with slight dysuria. His stream is slow, intermittently. He wants to know if he can resume his Flomax which he last took 2 years ago prior to his TURP. No GI difficulties  Physical Examination: There were no vitals filed for this visit..  Weight: 251 lb (113.853 kg). No change.  Impression: Tolerating radiation therapy well. He is having what sounds like obstructive symptoms, and I told him that we could resume his Flomax to see if he improves. If there is no improvement then he may be having bladder spasticity/irritability  Plan: Continue radiation therapy as planned.

## 2011-03-14 ENCOUNTER — Ambulatory Visit
Admission: RE | Admit: 2011-03-14 | Discharge: 2011-03-14 | Disposition: A | Discharge status: Home / Self Care | Payer: Medicare Other | Source: Ambulatory Visit | Attending: Radiation Oncology | Admitting: Radiation Oncology

## 2011-03-15 ENCOUNTER — Ambulatory Visit
Admission: RE | Admit: 2011-03-15 | Discharge: 2011-03-15 | Disposition: A | Discharge status: Home / Self Care | Payer: Medicare Other | Source: Ambulatory Visit | Attending: Radiation Oncology | Admitting: Radiation Oncology

## 2011-03-16 ENCOUNTER — Ambulatory Visit
Admission: RE | Admit: 2011-03-16 | Discharge: 2011-03-16 | Disposition: A | Discharge status: Home / Self Care | Payer: Medicare Other | Source: Ambulatory Visit | Attending: Radiation Oncology | Admitting: Radiation Oncology

## 2011-03-19 ENCOUNTER — Ambulatory Visit
Admission: RE | Admit: 2011-03-19 | Discharge: 2011-03-19 | Disposition: A | Discharge status: Home / Self Care | Payer: Medicare Other | Source: Ambulatory Visit | Attending: Radiation Oncology | Admitting: Radiation Oncology

## 2011-03-19 ENCOUNTER — Encounter: Payer: Self-pay | Admitting: Radiation Oncology

## 2011-03-19 VITALS — BP 158/98 | HR 89 | Resp 18 | Wt 253.9 lb

## 2011-03-19 DIAGNOSIS — C61 Malignant neoplasm of prostate: Secondary | ICD-10-CM

## 2011-03-19 NOTE — Progress Notes (Signed)
Patient presents to the clinic today unaccompanied for under treat visit with Dr. Dayton Scrape. Patient is alert and oriented to person, place, and time. No distress noted. Steady gait noted. Pleasant affect noted. Patient denies pain at this time. Patient reports he plans to work for the next two weeks. Patient reports that started back on his Flomax has help his frequency. Patient reports getting up once a night to void. Patient denies any burning upon urination. Patient has no other complaints. All findings reported to Dr. Dayton Scrape.

## 2011-03-19 NOTE — Progress Notes (Signed)
Weekly Management Note:  Site:Prostate Current Dose:  3315  cGy Projected Dose: 7800  cGy  Narrative: The patient is seen today for routine under treatment assessment. CBCT/MVCT images/port films were reviewed. The chart was reviewed. Bladder filling today is excellent.   He is doing well from a GU and GI standpoint. He resume his Flomax and this is quite helpful.  Physical Examination:  Filed Vitals:   03/19/11 0908  BP: 158/98  Pulse: 89  Resp: 18  .  Weight: 253 lb 14.4 oz (115.168 kg). No change.  Impression: Tolerating radiation therapy well.  Plan: Continue radiation therapy as planned.

## 2011-03-20 ENCOUNTER — Ambulatory Visit
Admission: RE | Admit: 2011-03-20 | Discharge: 2011-03-20 | Disposition: A | Discharge status: Home / Self Care | Payer: Medicare Other | Source: Ambulatory Visit | Attending: Radiation Oncology | Admitting: Radiation Oncology

## 2011-03-21 ENCOUNTER — Ambulatory Visit
Admission: RE | Admit: 2011-03-21 | Discharge: 2011-03-21 | Disposition: A | Discharge status: Home / Self Care | Payer: Medicare Other | Source: Ambulatory Visit | Attending: Radiation Oncology | Admitting: Radiation Oncology

## 2011-03-22 ENCOUNTER — Ambulatory Visit
Admission: RE | Admit: 2011-03-22 | Discharge: 2011-03-22 | Disposition: A | Discharge status: Home / Self Care | Payer: Medicare Other | Source: Ambulatory Visit | Attending: Radiation Oncology | Admitting: Radiation Oncology

## 2011-03-23 ENCOUNTER — Ambulatory Visit
Admission: RE | Admit: 2011-03-23 | Discharge: 2011-03-23 | Disposition: A | Discharge status: Home / Self Care | Payer: Medicare Other | Source: Ambulatory Visit | Attending: Radiation Oncology | Admitting: Radiation Oncology

## 2011-03-26 ENCOUNTER — Encounter: Payer: Self-pay | Admitting: Radiation Oncology

## 2011-03-26 ENCOUNTER — Ambulatory Visit
Admission: RE | Admit: 2011-03-26 | Discharge: 2011-03-26 | Disposition: A | Discharge status: Home / Self Care | Payer: Medicare Other | Source: Ambulatory Visit | Attending: Radiation Oncology | Admitting: Radiation Oncology

## 2011-03-26 VITALS — BP 157/92 | HR 88 | Resp 18 | Wt 251.8 lb

## 2011-03-26 DIAGNOSIS — C61 Malignant neoplasm of prostate: Secondary | ICD-10-CM

## 2011-03-26 NOTE — Progress Notes (Signed)
Weekly Management Note:  Site:Prostate Current Dose:  4290  cGy Projected Dose: 7800  cGy  Narrative: The patient is seen today for routine under treatment assessment. CBCT/MVCT images/port films were reviewed. The chart was reviewed.   Excellent bladder filling. Doing well from a GU and GI standpoint. Flomax is quite helpful  Physical Examination:  Filed Vitals:   03/26/11 0925  BP: 157/92  Pulse: 88  Resp: 18  .  Weight: 251 lb 12.8 oz (114.216 kg). No change.  Impression: Tolerating radiation therapy well.  Plan: Continue radiation therapy as planned.

## 2011-03-26 NOTE — Progress Notes (Signed)
Patient presents to the clinic today unaccompanied for under treat visit with Dr. Dayton Scrape. Patient is alert and oriented to person, place, and time. No distress noted. Steady gait noted. Pleasant affect noted. Patient plan to work this week. Patient denies burning upon urination. Patient denies blood in the urine. Patient reports Flomax continues to help. Patient reports he gets up to void once per night on average.

## 2011-03-27 ENCOUNTER — Ambulatory Visit
Admission: RE | Admit: 2011-03-27 | Discharge: 2011-03-27 | Disposition: A | Discharge status: Home / Self Care | Payer: Medicare Other | Source: Ambulatory Visit | Attending: Radiation Oncology | Admitting: Radiation Oncology

## 2011-03-28 ENCOUNTER — Ambulatory Visit
Admission: RE | Admit: 2011-03-28 | Discharge: 2011-03-28 | Disposition: A | Discharge status: Home / Self Care | Payer: Medicare Other | Source: Ambulatory Visit | Attending: Radiation Oncology | Admitting: Radiation Oncology

## 2011-03-29 ENCOUNTER — Ambulatory Visit
Admission: RE | Admit: 2011-03-29 | Discharge: 2011-03-29 | Disposition: A | Discharge status: Home / Self Care | Payer: Medicare Other | Source: Ambulatory Visit | Attending: Radiation Oncology | Admitting: Radiation Oncology

## 2011-03-30 ENCOUNTER — Ambulatory Visit
Admission: RE | Admit: 2011-03-30 | Discharge: 2011-03-30 | Disposition: A | Discharge status: Home / Self Care | Payer: Medicare Other | Source: Ambulatory Visit | Attending: Radiation Oncology | Admitting: Radiation Oncology

## 2011-04-02 ENCOUNTER — Ambulatory Visit
Admission: RE | Admit: 2011-04-02 | Discharge: 2011-04-02 | Disposition: A | Discharge status: Home / Self Care | Payer: Medicare Other | Source: Ambulatory Visit | Attending: Radiation Oncology | Admitting: Radiation Oncology

## 2011-04-02 ENCOUNTER — Encounter: Payer: Self-pay | Admitting: Radiation Oncology

## 2011-04-02 VITALS — Wt 252.3 lb

## 2011-04-02 DIAGNOSIS — C61 Malignant neoplasm of prostate: Secondary | ICD-10-CM

## 2011-04-02 NOTE — Progress Notes (Signed)
Weekly Management Note:  Site:prostate Current Dose:  5265  cGy Projected Dose: 7800  cGy  Narrative: The patient is seen today for routine under treatment assessment. CBCT/MVCT images/port films were reviewed. The chart was reviewed.  Satisfactory bladder filling. He does have mild urinary frequency but is otherwise doing well from a GU and GI standpoint.  Physical Examination: There were no vitals filed for this visit..  Weight: 252 lb 4.8 oz (114.443 kg). No change.  Impression: Tolerating radiation therapy well.  Plan: Continue radiation therapy as planned.

## 2011-04-02 NOTE — Progress Notes (Signed)
Pt states urinary freq improved w/resuming Flomax, but occass has more freq.

## 2011-04-03 ENCOUNTER — Ambulatory Visit
Admission: RE | Admit: 2011-04-03 | Discharge: 2011-04-03 | Disposition: A | Discharge status: Home / Self Care | Payer: Medicare Other | Source: Ambulatory Visit | Attending: Radiation Oncology | Admitting: Radiation Oncology

## 2011-04-04 ENCOUNTER — Ambulatory Visit
Admission: RE | Admit: 2011-04-04 | Discharge: 2011-04-04 | Disposition: A | Discharge status: Home / Self Care | Payer: Medicare Other | Source: Ambulatory Visit | Attending: Radiation Oncology | Admitting: Radiation Oncology

## 2011-04-05 ENCOUNTER — Ambulatory Visit
Admission: RE | Admit: 2011-04-05 | Discharge: 2011-04-05 | Disposition: A | Discharge status: Home / Self Care | Payer: Medicare Other | Source: Ambulatory Visit | Attending: Radiation Oncology | Admitting: Radiation Oncology

## 2011-04-06 ENCOUNTER — Ambulatory Visit
Admission: RE | Admit: 2011-04-06 | Discharge: 2011-04-06 | Disposition: A | Discharge status: Home / Self Care | Payer: Medicare Other | Source: Ambulatory Visit | Attending: Radiation Oncology | Admitting: Radiation Oncology

## 2011-04-09 ENCOUNTER — Ambulatory Visit
Admission: RE | Admit: 2011-04-09 | Discharge: 2011-04-09 | Disposition: A | Discharge status: Home / Self Care | Payer: Medicare Other | Source: Ambulatory Visit | Attending: Radiation Oncology | Admitting: Radiation Oncology

## 2011-04-09 ENCOUNTER — Encounter: Payer: Self-pay | Admitting: Radiation Oncology

## 2011-04-09 VITALS — BP 147/84 | HR 66 | Resp 18 | Wt 251.8 lb

## 2011-04-09 DIAGNOSIS — C61 Malignant neoplasm of prostate: Secondary | ICD-10-CM

## 2011-04-09 NOTE — Progress Notes (Signed)
Patient presented to the clinic today unaccompanied for under treat visit with Dr. Dayton Scrape. Patient is alert and oriented to person, place, and time. No distress noted. Steady gait noted. Pleasant affect noted. Patient denies pain at this time. Patient denies nausea, vomiting, diarrhea, or headache. Patient denies burning upon urination or hematuria. Patient has no complaints at this time. Reported all findings to Dr. Dayton Scrape.

## 2011-04-09 NOTE — Progress Notes (Signed)
Weekly Management Note:  Site:Prostate Current Dose:  6240  cGy Projected Dose: 7800  cGy  Narrative: The patient is seen today for routine under treatment assessment. CBCT/MVCT images/port films were reviewed. The chart was reviewed.   Excellent bladder filling. No GU or GI difficulties.  Physical Examination:  Filed Vitals:   04/09/11 0946  BP: 147/84  Pulse: 66  Resp: 18  .  Weight: 251 lb 12.8 oz (114.216 kg). No change.  Impression: Tolerating radiation therapy well.  Plan: Continue radiation therapy as planned.

## 2011-04-10 ENCOUNTER — Ambulatory Visit
Admission: RE | Admit: 2011-04-10 | Discharge: 2011-04-10 | Disposition: A | Discharge status: Home / Self Care | Payer: Medicare Other | Source: Ambulatory Visit | Attending: Radiation Oncology | Admitting: Radiation Oncology

## 2011-04-11 ENCOUNTER — Ambulatory Visit
Admission: RE | Admit: 2011-04-11 | Discharge: 2011-04-11 | Disposition: A | Discharge status: Home / Self Care | Payer: Medicare Other | Source: Ambulatory Visit | Attending: Radiation Oncology | Admitting: Radiation Oncology

## 2011-04-12 ENCOUNTER — Ambulatory Visit
Admission: RE | Admit: 2011-04-12 | Discharge: 2011-04-12 | Disposition: A | Discharge status: Home / Self Care | Payer: Medicare Other | Source: Ambulatory Visit | Attending: Radiation Oncology | Admitting: Radiation Oncology

## 2011-04-13 ENCOUNTER — Ambulatory Visit: Admission: RE | Admit: 2011-04-13 | Payer: Medicare Other | Source: Ambulatory Visit

## 2011-04-16 ENCOUNTER — Encounter: Payer: Self-pay | Admitting: Radiation Oncology

## 2011-04-16 ENCOUNTER — Ambulatory Visit
Admission: RE | Admit: 2011-04-16 | Discharge: 2011-04-16 | Disposition: A | Discharge status: Home / Self Care | Payer: Medicare Other | Source: Ambulatory Visit | Attending: Radiation Oncology | Admitting: Radiation Oncology

## 2011-04-16 VITALS — BP 137/87 | HR 68 | Resp 18 | Wt 250.0 lb

## 2011-04-16 DIAGNOSIS — C61 Malignant neoplasm of prostate: Secondary | ICD-10-CM

## 2011-04-16 NOTE — Progress Notes (Signed)
Weekly Management Note:  Site:Prostate Current Dose:  6825  cGy Projected Dose: 7800  cGy  Narrative: The patient is seen today for routine under treatment assessment. CBCT/MVCT images/port films were reviewed. The chart was reviewed. Bladder filling is satisfactory.   No GU or GI difficulties.  Physical Examination:  Filed Vitals:   04/16/11 0952  BP: 137/87  Pulse: 68  Resp: 18  .  Weight: 250 lb (113.399 kg). No change.  Impression: Tolerating radiation therapy well.  Plan: Continue radiation therapy as planned. He will finish his radiation therapy this Friday and then return here for a one-month followup visit.

## 2011-04-16 NOTE — Progress Notes (Signed)
Patient presents to the clinic today for under treat visit with Dr. Dayton Scrape. Patient is alert and oriented to person, place, and time. No distress noted. Steady gait noted. Pleasant affect noted. Patient denies pain at this time. Patient has no complaints. Patient denies hematuria, burning upon urination, nausea, vomiting, diarrhea, or headache. Reported all findings to Dr. Dayton Scrape.

## 2011-04-17 ENCOUNTER — Ambulatory Visit
Admission: RE | Admit: 2011-04-17 | Discharge: 2011-04-17 | Disposition: A | Discharge status: Home / Self Care | Payer: Medicare Other | Source: Ambulatory Visit | Attending: Radiation Oncology | Admitting: Radiation Oncology

## 2011-04-18 ENCOUNTER — Ambulatory Visit
Admission: RE | Admit: 2011-04-18 | Discharge: 2011-04-18 | Disposition: A | Discharge status: Home / Self Care | Payer: Medicare Other | Source: Ambulatory Visit | Attending: Radiation Oncology | Admitting: Radiation Oncology

## 2011-04-18 DIAGNOSIS — Z86718 Personal history of other venous thrombosis and embolism: Secondary | ICD-10-CM | POA: Insufficient documentation

## 2011-04-18 DIAGNOSIS — Z510 Encounter for antineoplastic radiation therapy: Secondary | ICD-10-CM | POA: Insufficient documentation

## 2011-04-18 DIAGNOSIS — C61 Malignant neoplasm of prostate: Secondary | ICD-10-CM | POA: Insufficient documentation

## 2011-04-18 DIAGNOSIS — Z79899 Other long term (current) drug therapy: Secondary | ICD-10-CM | POA: Insufficient documentation

## 2011-04-18 DIAGNOSIS — E7800 Pure hypercholesterolemia, unspecified: Secondary | ICD-10-CM | POA: Insufficient documentation

## 2011-04-19 ENCOUNTER — Ambulatory Visit
Admission: RE | Admit: 2011-04-19 | Discharge: 2011-04-19 | Disposition: A | Discharge status: Home / Self Care | Payer: Medicare Other | Source: Ambulatory Visit | Attending: Radiation Oncology | Admitting: Radiation Oncology

## 2011-04-20 ENCOUNTER — Ambulatory Visit
Admission: RE | Admit: 2011-04-20 | Discharge: 2011-04-20 | Disposition: A | Discharge status: Home / Self Care | Payer: Medicare Other | Source: Ambulatory Visit | Attending: Radiation Oncology | Admitting: Radiation Oncology

## 2011-04-23 ENCOUNTER — Encounter: Payer: Self-pay | Admitting: Radiation Oncology

## 2011-04-23 NOTE — Progress Notes (Signed)
IMRT simulation/treatment planning note:  The patient underwent IMRT simulation/treatment planning on 02/15/2011. He was set up to 2 modulated arcs (VMAT). IMRT was chosen to decrease the risk for both the acute and late bladder and rectal toxicity compared to conventional or 3-D conformal radiation therapy. Dose volume histograms were obtained for the target structures including the seminal vesicles and prostate in addition to avoidance structures including the bladder, rectum, and femoral heads. Please see the EMR for specific dose volume histograms. I instructed him to be treated with a confluent full bladder. It daily gave the imaging was recommended, setting up to his gold seeds alone for weekly cone beam CT scan to assess bladder filling. He is to 6 MV photons with 2 modulated arcs.

## 2011-04-23 NOTE — Progress Notes (Signed)
Chart note: The patient started his radiation therapy with 2 modulated arcs on 02/21/2011. He was treated with 2 modulated arcs, representing one set of IMRT treatment devices [id number]).

## 2011-04-23 NOTE — Progress Notes (Signed)
[facility] Radiation Oncology  Name:Robert Miller  Date:04/23/2011           WUJ:[id number] DOB:[birth year]   Status:outpatient    CC: No primary provider on file.  Dr. Alexis Frock  REFERRING PHYSICIAN: Dr. 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 He resumed his Flomax early on during his course of therapy in this was quite helpful. No significant GI toxicity.                           PLAN: Routine followup in one month. Patient instructed to call if questions or worsening complaints in interim.

## 2011-05-04 NOTE — Telephone Encounter (Signed)
xxx

## 2011-05-15 ENCOUNTER — Ambulatory Visit
Admission: RE | Admit: 2011-05-15 | Discharge: 2011-05-15 | Disposition: A | Discharge status: Home / Self Care | Payer: Medicare Other | Source: Ambulatory Visit | Attending: Radiation Oncology | Admitting: Radiation Oncology

## 2011-05-15 ENCOUNTER — Encounter: Payer: Self-pay | Admitting: Radiation Oncology

## 2011-05-15 VITALS — BP 146/89 | HR 93 | Temp 97.1°F | Resp 18 | Wt 247.7 lb

## 2011-05-15 DIAGNOSIS — C61 Malignant neoplasm of prostate: Secondary | ICD-10-CM

## 2011-05-15 NOTE — Progress Notes (Signed)
Patient presents to the clinic today unaccompanied for follow up appointment with Dr. Dayton Scrape. Patient is alert and orient to person, place, and time. No distress noted. Steady gait noted. Pleasant affect noted. Patient denies pain at this time. Patient reports that for several day last week his pelvic region ached after bathing his wife. Patient denies burning with urination. Patient reports urgency. Patient denies hematuria. Patient reports that he gets up once per night to void. Patient reports that he is mostly working on the weekends. Patient reports his energy level is good. Patient scheduled to see Patsi Sears the first week of June. Patient stopped flomax two weeks ago. Reported all findings to Dr. Dayton Scrape.

## 2011-05-15 NOTE — Progress Notes (Signed)
Followup note:  Robert Miller returns today approximately 1 month following completion of external beam/IMRT and management of his initial stage TI a  intermediate risk adenocarcinoma prostate he is doing well from a GU and GI standpoint except for urinary urgency. He stopped his Flomax 2 weeks ago and did not note any change in his urinary habits. He believes that he strained a pelvic muscle a few weeks ago when pitting his wife, and his anterior pelvic discomfort is now improved. He reports nocturia x1.  Physical examination: Not performed today.  Impression: Satisfactory progress, however he does have some urinary urgency which may improve over the next one to 2 months.   Plan: He'll have a PSA in May and see Dr. Patsi Sears in early June. If he still has urinary urgency, this can be addressed by Dr. Patsi Sears. I've not scheduled Mr. Hickox for a formal followup visit, and I asked that Dr. Patsi Sears keep me posted on his progress.

## 2011-05-17 ENCOUNTER — Encounter: Payer: Self-pay | Admitting: *Deleted

## 2011-05-17 NOTE — Progress Notes (Signed)
01/18/2011= I=PSS =[id number] score=6

## 2012-04-02 ENCOUNTER — Other Ambulatory Visit: Payer: Self-pay | Admitting: Gastroenterology

## 2012-04-29 ENCOUNTER — Other Ambulatory Visit: Payer: Self-pay | Admitting: Urology

## 2012-05-20 ENCOUNTER — Encounter (HOSPITAL_BASED_OUTPATIENT_CLINIC_OR_DEPARTMENT_OTHER): Payer: Self-pay | Admitting: *Deleted

## 2012-05-20 NOTE — Progress Notes (Signed)
To [facility] at 0745-Hg,Ekg on arrival-Npo after Mn-

## 2012-05-25 NOTE — Anesthesia Preprocedure Evaluation (Addendum)
Anesthesia Evaluation  Patient identified by MRN, date of birth, ID band Patient awake    Reviewed: Allergy & Precautions, H&P , NPO status , Patient's Chart, lab work & pertinent test results, reviewed documented beta blocker date and time   Airway Mallampati: II TM Distance: >3 FB Neck ROM: full    Dental no notable dental hx. (+) Teeth Intact and Dental Advisory Given   Pulmonary  Hx. PE breath sounds clear to auscultation  Pulmonary exam normal       Cardiovascular Exercise Tolerance: Good hypertension, Rhythm:regular Rate:Normal     Neuro/Psych negative neurological ROS  negative psych ROS   GI/Hepatic negative GI ROS, Neg liver ROS,   Endo/Other  negative endocrine ROS  Renal/GU negative Renal ROS  negative genitourinary   Musculoskeletal   Abdominal   Peds  Hematology negative hematology ROS (+)   Anesthesia Other Findings   Reproductive/Obstetrics negative OB ROS                          Anesthesia Physical Anesthesia Plan  ASA: II  Anesthesia Plan: General   Post-op Pain Management:    Induction: Intravenous  Airway Management Planned: LMA  Additional Equipment:   Intra-op Plan:   Post-operative Plan:   Informed Consent: I have reviewed the patients History and Physical, chart, labs and discussed the procedure including the risks, benefits and alternatives for the proposed anesthesia with the patient or authorized representative who has indicated his/her understanding and acceptance.   Dental Advisory Given  Plan Discussed with: CRNA and Surgeon  Anesthesia Plan Comments:         Anesthesia Quick Evaluation

## 2012-05-26 ENCOUNTER — Encounter (HOSPITAL_BASED_OUTPATIENT_CLINIC_OR_DEPARTMENT_OTHER): Payer: Self-pay

## 2012-05-26 ENCOUNTER — Ambulatory Visit (HOSPITAL_BASED_OUTPATIENT_CLINIC_OR_DEPARTMENT_OTHER): Payer: Medicare Other | Admitting: Anesthesiology

## 2012-05-26 ENCOUNTER — Encounter (HOSPITAL_BASED_OUTPATIENT_CLINIC_OR_DEPARTMENT_OTHER): Payer: Self-pay | Admitting: Anesthesiology

## 2012-05-26 ENCOUNTER — Encounter (HOSPITAL_BASED_OUTPATIENT_CLINIC_OR_DEPARTMENT_OTHER)
Admission: RE | Disposition: A | Discharge status: Home / Self Care | Payer: Self-pay | Source: Ambulatory Visit | Attending: Urology

## 2012-05-26 ENCOUNTER — Other Ambulatory Visit: Payer: Self-pay

## 2012-05-26 ENCOUNTER — Ambulatory Visit (HOSPITAL_BASED_OUTPATIENT_CLINIC_OR_DEPARTMENT_OTHER)
Admission: RE | Admit: 2012-05-26 | Discharge: 2012-05-26 | Disposition: A | Discharge status: Home / Self Care | Payer: Medicare Other | Source: Ambulatory Visit | Attending: Urology | Admitting: Urology

## 2012-05-26 DIAGNOSIS — N210 Calculus in bladder: Secondary | ICD-10-CM | POA: Insufficient documentation

## 2012-05-26 DIAGNOSIS — Z86711 Personal history of pulmonary embolism: Secondary | ICD-10-CM | POA: Insufficient documentation

## 2012-05-26 DIAGNOSIS — Z79899 Other long term (current) drug therapy: Secondary | ICD-10-CM | POA: Insufficient documentation

## 2012-05-26 DIAGNOSIS — N320 Bladder-neck obstruction: Secondary | ICD-10-CM | POA: Insufficient documentation

## 2012-05-26 DIAGNOSIS — N3289 Other specified disorders of bladder: Secondary | ICD-10-CM | POA: Insufficient documentation

## 2012-05-26 DIAGNOSIS — C61 Malignant neoplasm of prostate: Secondary | ICD-10-CM | POA: Insufficient documentation

## 2012-05-26 DIAGNOSIS — E7800 Pure hypercholesterolemia, unspecified: Secondary | ICD-10-CM | POA: Insufficient documentation

## 2012-05-26 DIAGNOSIS — I10 Essential (primary) hypertension: Secondary | ICD-10-CM | POA: Insufficient documentation

## 2012-05-26 DIAGNOSIS — Z7982 Long term (current) use of aspirin: Secondary | ICD-10-CM | POA: Insufficient documentation

## 2012-05-26 HISTORY — PX: CYSTOSCOPY WITH LITHOLAPAXY: SHX1425

## 2012-05-26 HISTORY — DX: Calculus in bladder: N21.0

## 2012-05-26 HISTORY — PX: GREEN LIGHT LASER TURP (TRANSURETHRAL RESECTION OF PROSTATE: SHX6260

## 2012-05-26 LAB — POCT HEMOGLOBIN-HEMACUE: Hemoglobin: 13.7 g/dL (ref 13.0–17.0)

## 2012-05-26 SURGERY — CYSTOSCOPY, WITH BLADDER CALCULUS LITHOLAPAXY
Anesthesia: General | Site: Prostate | Wound class: Clean Contaminated

## 2012-05-26 MED ORDER — OXYCODONE-ACETAMINOPHEN 5-325 MG PO TABS: 1.0000 | ORAL_TABLET | ORAL | Status: DC | PRN

## 2012-05-26 MED ORDER — SODIUM CHLORIDE 0.9 % IR SOLN
Status: DC | PRN
  Administered 2012-05-26: 9000 mL via INTRAVESICAL

## 2012-05-26 MED ORDER — PROPOFOL 10 MG/ML IV BOLUS
INTRAVENOUS | Status: DC | PRN
  Administered 2012-05-26: 160 mg via INTRAVENOUS

## 2012-05-26 MED ORDER — DEXAMETHASONE SODIUM PHOSPHATE 4 MG/ML IJ SOLN
INTRAMUSCULAR | Status: DC | PRN
  Administered 2012-05-26: 8 mg via INTRAVENOUS

## 2012-05-26 MED ORDER — LACTATED RINGERS IV SOLN
INTRAVENOUS | Status: DC
Start: 2012-05-26 — End: 2012-05-26
  Filled 2012-05-26: qty 1000

## 2012-05-26 MED ORDER — SODIUM CHLORIDE 0.9 % IV SOLN
INTRAVENOUS | Status: DC
  Filled 2012-05-26: qty 1000

## 2012-05-26 MED ORDER — ACETAMINOPHEN 10 MG/ML IV SOLN
INTRAVENOUS | Status: DC | PRN
  Administered 2012-05-26: 1000 mg via INTRAVENOUS

## 2012-05-26 MED ORDER — LACTATED RINGERS IV SOLN
INTRAVENOUS | Status: DC
  Administered 2012-05-26: 08:00:00 via INTRAVENOUS
  Filled 2012-05-26: qty 1000

## 2012-05-26 MED ORDER — SODIUM CHLORIDE 0.9 % IV SOLN
INTRAVENOUS | Status: DC | PRN
  Administered 2012-05-26: 1000 mL via INTRAMUSCULAR

## 2012-05-26 MED ORDER — ONDANSETRON HCL 4 MG/2ML IJ SOLN
INTRAMUSCULAR | Status: DC | PRN
  Administered 2012-05-26: 4 mg via INTRAVENOUS

## 2012-05-26 MED ORDER — PHENAZOPYRIDINE HCL 200 MG PO TABS: 200.0000 mg | ORAL_TABLET | Freq: Three times a day (TID) | ORAL | Status: DC | PRN

## 2012-05-26 MED ORDER — HYDROMORPHONE HCL PF 1 MG/ML IJ SOLN
0.2500 mg | INTRAMUSCULAR | Status: DC | PRN
  Filled 2012-05-26: qty 1

## 2012-05-26 MED ORDER — PROMETHAZINE HCL 25 MG/ML IJ SOLN
6.2500 mg | INTRAMUSCULAR | Status: DC | PRN
  Filled 2012-05-26: qty 1

## 2012-05-26 MED ORDER — FENTANYL CITRATE 0.05 MG/ML IJ SOLN
25.0000 ug | INTRAMUSCULAR | Status: DC | PRN
  Filled 2012-05-26: qty 1

## 2012-05-26 MED ORDER — FENTANYL CITRATE 0.05 MG/ML IJ SOLN
INTRAMUSCULAR | Status: DC | PRN
  Administered 2012-05-26: 75 ug via INTRAVENOUS
  Administered 2012-05-26 (×3): 25 ug via INTRAVENOUS

## 2012-05-26 MED ORDER — CIPROFLOXACIN HCL 500 MG PO TABS: 500.0000 mg | ORAL_TABLET | Freq: Two times a day (BID) | ORAL | Status: DC

## 2012-05-26 MED ORDER — LACTATED RINGERS IV SOLN
INTRAVENOUS | Status: DC
  Filled 2012-05-26: qty 1000

## 2012-05-26 MED ORDER — MIDAZOLAM HCL 5 MG/5ML IJ SOLN
INTRAMUSCULAR | Status: DC | PRN
  Administered 2012-05-26: 1 mg via INTRAVENOUS

## 2012-05-26 MED ORDER — CEFAZOLIN SODIUM-DEXTROSE 2-3 GM-% IV SOLR
2.0000 g | INTRAVENOUS | Status: AC
  Administered 2012-05-26: 2 g via INTRAVENOUS
  Filled 2012-05-26: qty 50

## 2012-05-26 MED ORDER — LIDOCAINE HCL (CARDIAC) 20 MG/ML IV SOLN
INTRAVENOUS | Status: DC | PRN
  Administered 2012-05-26: 60 mg via INTRAVENOUS

## 2012-05-26 MED ORDER — BELLADONNA ALKALOIDS-OPIUM 16.2-60 MG RE SUPP
RECTAL | Status: DC | PRN
  Administered 2012-05-26: 1 via RECTAL

## 2012-05-26 SURGICAL SUPPLY — 27 items
BAG URINE DRAINAGE (UROLOGICAL SUPPLIES) ×3 IMPLANT
BAG URO CATCHER STRL LF (DRAPE) ×3 IMPLANT
BASKET ZERO TIP NITINOL 2.4FR (BASKET) IMPLANT
BSKT STON RTRVL ZERO TP 2.4FR (BASKET)
CATH FOLEY 2WAY SLVR 30CC 22FR (CATHETERS) ×1 IMPLANT
CLOTH BEACON ORANGE TIMEOUT ST (SAFETY) ×3 IMPLANT
DRAPE CAMERA CLOSED 9X96 (DRAPES) ×3 IMPLANT
ELECT BUTTON HF 24-28F 2 30DE (ELECTRODE) IMPLANT
ELECT LOOP MED HF 24F 12D (CUTTING LOOP) IMPLANT
ELECT LOOP MED HF 24F 12D CBL (CLIP) IMPLANT
ELECT RESECT VAPORIZE 12D CBL (ELECTRODE) IMPLANT
ELECTROHYDROLIC PROBE 9FR (MISCELLANEOUS) IMPLANT
FEE RENTAL LASER GREENLIGHT (Laser) IMPLANT
GLOVE BIO SURGEON STRL SZ7.5 (GLOVE) ×3 IMPLANT
GLOVE BIOGEL M STRL SZ7.5 (GLOVE) ×3 IMPLANT
GOWN PREVENTION PLUS LG XLONG (DISPOSABLE) ×3 IMPLANT
GOWN STRL REIN XL XLG (GOWN DISPOSABLE) ×3 IMPLANT
HOLDER FOLEY CATH W/STRAP (MISCELLANEOUS) IMPLANT
IV NS IRRIG 3000ML ARTHROMATIC (IV SOLUTION) ×3 IMPLANT
LASER FIBER /GREENLIGHT LASER (Laser) ×1 IMPLANT
LASER FIBER DISP (UROLOGICAL SUPPLIES) ×2 IMPLANT
LASER FIBER DISP 1000U (UROLOGICAL SUPPLIES) ×2 IMPLANT
LASER GREENLIGHT RENTAL P/PROC (Laser) ×3 IMPLANT
PACK CYSTOSCOPY (CUSTOM PROCEDURE TRAY) ×3 IMPLANT
SYR 30ML LL (SYRINGE) IMPLANT
SYRINGE IRR TOOMEY STRL 70CC (SYRINGE) IMPLANT
WATER STERILE IRR 3000ML UROMA (IV SOLUTION) ×3 IMPLANT

## 2012-05-26 NOTE — Op Note (Signed)
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  Surgeon:  Kathie Rhodes. Patsi Sears, MD  First assistant:  None  Anesthesia:  general  Preparation:  After appropriate preanesthesia, the patient was brought to the operating room, placed on the operating table in the dorsal supine position, where general LMA anesthesia was introduced. He was then replaced in the dorsal lithotomy position with pubis was prepped with Betadine solution and draped in usual fashion. Armband was double checked.  Review history:   Assessed  1. Bladder Calculus 594.1  2. Gross Hematuria 599.71  3. Prostate Cancer 185  Mr Oyama has significant bladder outlet symptoms, despite hx of TURP and IMRT in the past. He has gross hematuria and bladder stone currently, and cysto shows both stone, and severe trabecullation and cellule formation. He will have cystoloitholopoaxy and Green Light Laser of the obstructing L lateral lobe, and a "trench" at 5, and 7 o'clock. Wil go home with catheter. Biggest risk is for incontinence. Explained to pt and his family.    Statement of  Likelihood of Success: Excellent. TIME-OUT observed.:  Procedure:  Cystourethroscopy revealed the vera montanum was intact. There was a large volume of left lateral prostatic tissue, which was firm. This tissue was growing over the midline. There were edematous polyps emanating from the mucosal surface of the prostate as well. On the right side, the bladder neck was firm, and there was some growth of prostate, although not as much as on the left side. The bladder neck was rigid.  Cystourethroscopy revealed marked trabeculation and cellule formation. 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  Using the green light laser, a trench was developed at the 5:00 and at the 7:00 positions  using lower energy, and 80 W. The patient then had vaporization of the lateral prostate, particularly on the left side, with excellent vaporization, and no bleeding noted. At the end of procedure, cystoscopy revealed completely broken down stone, which was easily evacuated from the bladder with the piston syringe. No bleeding was noted. The vera was still intact. The flex will cystoscope was used to ensure that there was no more stone embedded of the bladder neck of the prostate. Additional cellules were identified at the level of the bladder neck and photo documented. A 22 French Foley catheter with 10 cc balloon was placed without difficulty. Again, no bleeding was noted. Irrigation was completed with saline. The patient was given 2 g of IV Ancef at the beginning of the procedure, as well as IV Tylenol at the beginning of the procedure. He was given IV Toradol at the end of procedure. He was awakened, and taken to recovery room in good condition.

## 2012-05-26 NOTE — H&P (Signed)
Active Problems Problems  1. Gross Hematuria 599.71 2. Prostate Cancer 185 External beam/IMRT from 02/21/11 thru 04/20/11 total 40 sessions  History of Present Illness       72 YO male returns today for cystoscopy for hx intermittent gross hematuria and blood in stools X 2 months.  He saw Dr. Evette Cristal in reference to rectal bleeding & had a colonoscopy on 04/02/12.  Does have h/o tobacco use but none in >47 yrs. H/O chemical exposure (worked in [employer]) and IMRT X 40 sessions 2012-13.   Hx of prostate cancer.    S/P TUR-P for urinary retention on 05/09/09, with path showing G 3+3=6 CaP, <2% of the tissue.      S/P repeat repair ventral hernia in Oct. 2011 & had some po complications of pulmonary embolus.   S/P prostate biopsy in Oct. 2012, has 1/12 biopsies + for G 4+3=7 CaP in the R lateral base, and HgPin in the R lateral apex.  He had external beam/IMRT from 02/21/11 thru 04/20/11 with 40 sessions.  02/11/12: PSA- 0.79 08/09/11  PSA - 1.11  11/15/10  PSA - 2.67   Past Medical History Problems  1. History of  Hypercholesterolemia 272.0 2. History of  Pulmonary Embolism V12.55  Surgical History Problems  1. History of  Appendectomy 2. History of  Cataract Surgery Bilateral 3. History of  Transurethral Resection Of Prostate (TURP) 4. History of  Ventral Hernia Repair 5. History of  Ventral Hernia Repair  Current Meds 1. Aspirin 81 MG Oral Tablet; Therapy: (Recorded:18Sep2012) to 2. Cinnamon CAPS; Therapy: (Recorded:17Jan2011) to 3. CoQ10 CAPS; Therapy: (Recorded:17Jan2011) to 4. Gemfibrozil 600 MG Oral Tablet; Therapy: (Recorded:17Jan2011) to 5. MSM CAPS; Therapy: (Recorded:17Jan2011) to 6. Multiple Vitamin TABS; Therapy: (Recorded:17Jan2011) to 7. Omega-3 Plus CAPS; Therapy: (Recorded:17Jan2011) to 8. Prostate Formula TABS; Therapy: (Recorded:17Jan2011) to 9. Vitamin D3 TABS; Therapy: (Recorded:18Sep2012) to  Allergies Medication  1. Sudafed 12 Hour TB12  Family  History Problems  1. Paternal history of  Acute Myocardial Infarction V17.3 2. Family history of  Family Health Status Number Of Children 2 sons, 2 daughters 3. Family history of  Father Deceased At Age ____ 22, heart attack 4. Family history of  Mother Deceased At Age ____ 55, stroke 5. Sororal history of  Stroke Syndrome V17.1 6. Maternal history of  Stroke Syndrome V17.1  Social History Problems  1. Caffeine Use 3 per day 2. Former Smoker V15.82 1 ppd x 5 yrs, quit 43 yrs ago 3. Marital History - Currently Married 4. Occupation: Retired Nurse, children's  5. History of  Alcohol Use  Review of Systems Genitourinary, constitutional, skin, eye, otolaryngeal, hematologic/lymphatic, cardiovascular, pulmonary, endocrine, musculoskeletal, gastrointestinal, neurological and psychiatric system(s) were reviewed and pertinent findings if present are noted.  Genitourinary: urinary frequency, feelings of urinary urgency, nocturia and hematuria.    Vitals Vital Signs [Data Includes: Last 1 Day]  12Feb2014 11:22AM  Blood Pressure: 158 / 92 Temperature: 98.4 F Heart Rate: 83  Physical Exam Rectal: Rectal exam demonstrates normal sphincter tone, no tenderness and no masses. Estimated prostate size is 1+. Normal rectal tone, no rectal masses, prostate is smooth, symmetric and non-tender. The prostate has no nodularity and is not tender. The left seminal vesicle is nonpalpable. The right seminal vesicle is nonpalpable. The perineum is normal on inspection.    Results/Data Selected Results  AU CT-HEMATURIA PROTOCOL 10Jan2014 12:00AM Jetta Lout   Test Name Result Flag Reference  ** RADIOLOGY REPORT BY [facility] **   ADDENDUM CREATED: 03/21/2012  15:27:06  Impression #5 should state "calcification within the GALLBLADDER fundus..."  END ADDENDUM SIGNED BY: Karn Cassis. Reche Dixon, M.D.  *RADIOLOGY REPORT*  Clinical Data: Gross hematuria starting in December. Intermittent. Prostate  cancer diagnosed in 2011 with radiation therapy in 2012. No current complaints. History of renal stones. Prior TURP.  CT ABDOMEN AND PELVIS WITHOUT AND WITH CONTRAST  Technique: Multidetector CT imaging of the abdomen and pelvis was performed without contrast material in one or both body regions, followed by contrast material(s) and further sections in one or both body regions.  Contrast: 125 ml Isovue 300  Comparison: Plain films of 01/03/2010. CT of 11/17/2009.  Findings: Lung bases: Mild scarring at the right lung base. Normal heart size without pericardial or pleural effusion. Tiny fat containing right paravertebral Bochdalek hernia containing fat.  Kidneys/Ureters/Bladder: No renal calculi or hydronephrosis. No ureteric calculi. Calcification within the right paracentral bladder base measures 5 mm on image 78/series 2 and is suspicious for a stone. This is favored to be separate from the adjacent median lobe of the prostate.  No renal mass. Moderate renal collecting system opacification on delayed images. Ureters well opacified. No filling defect identified.  No bladder mass identified. Bladder moderately well opacified on delayed images, without gross filling defect. There is mild bladder wall irregularity, presumably secondary to mild prostatomegaly.  Other: Mild hepatic steatosis, without focal liver lesions. Normal spleen, stomach. Stone within the gallbladder fundus versus other calcified process. No acute cholecystitis or biliary ductal dilatation.  Normal adrenal glands. No retroperitoneal or retrocrural adenopathy.  Extensive colonic diverticulosis. Sigmoid muscular hypertrophy. Normal terminal ileum. Left paracentral ventral abdominal wall laxity is decreased since 2011 has nonobstructive small bowel within. Newly inferiorly is a fat containing ventral abdominal wall hernia.  Bilateral fat containing inguinal hernias. No pelvic adenopathy. Mild  prostatomegaly, with radiation seeds in the prostate. No significant free fluid.  Bones/Musculoskeletal: Partial fusion of the bilateral sacroiliac joints which is likely degenerative. A sclerotic lesion in the right iliac on image 58/series 3 is unchanged since 2011 and likely a bone island.  IMPRESSION:   Please see addendum included in this report  1. Bladder base calcification is favored to represent a dependent bladder stone. Given the position of this calcification and the presence of concurrent prostatomegaly, prostatic calcification with secondary impression into the urinary bladder is felt less likely but cannot be excluded. 2. No other explanation for hematuria. 3. Bladder wall irregularity, most consistent with a component of bladder outlet obstruction. 4. Hepatomegaly. 5. Calcification in the uterine fundus is most likely an adherent stone. Other entities, including wall calcification or calcified gallbladder polyp versus adenomyomatosis could look similar. New since prior.  Consider non emergent abdominal ultrasound.   Original Report Authenticated By: Jeronimo Greaves, M.D.   Procedure  Procedure: Cystoscopy  Chaperone Present: kim lewis.  Indication: Hematuria. Lower Urinary Tract Symptoms.  Informed Consent: Risks, benefits, and potential adverse events were discussed and informed consent was obtained from the patient.  Prep: The patient was prepped with betadine.  Anesthesia:. Local anesthesia was administered intraurethrally with 2% lidocaine jelly.  Antibiotic prophylaxis: Ciprofloxacin.  Procedure Note:  Urethral meatus:. No abnormalities.  Anterior urethra: No abnormalities.  Prostatic urethra:. There was visual obstruction of the prostatic urethra. Left lateral lobe hypertrophy over midline.  Bladder: Visulization was clear. Examination of the bladder demonstrated trabeculation, but no clot within the bladder cellules, but no edema. A single  a stone was  present in the bladder measuring approximately 1 cm in  size.  Stone location behind upper portion of R bladder neck bladder neck, as seen with flexible scope. The patient tolerated the procedure well.  Complications: None.    Assessment Assessed  1. Bladder Calculus 594.1 2. Gross Hematuria 599.71 3. Prostate Cancer 185   Mr Robin has significant bladder outlet symptoms, despite hx of TURP and IMRT in the past. He has gross hematuria and bladder stone currently, and cysto shows both stone, and severe trabecullation and cellule formation. He will have cystoloitholopoaxy and Green Light Laser of the obstructing L lateral lobe, and a "trench" at 5, and 7 o'clock. Wil go home with catheter. Biggest risk is for incontinence. Explained to pt and his family.   Plan     UA With REFLEX  Status: Resulted - Requires Verification  Done: 01Jan0001 12:00AM Schedule GLL and cystolitholopaxy.  Ordered Today; For: Health Maintenance (V70.0); Ordered By: Jethro Bolus  Due: 14Feb2014 Marked Important; Last Updated By: Junius Argyle Electronically signed by : Jethro Bolus, M.D.; Apr 28 2012  9:18AM

## 2012-05-26 NOTE — Interval H&P Note (Signed)
History and Physical Interval Note:  05/26/2012 9:06 AM  Robert Miller  has presented today for surgery, with the diagnosis of urinary retention and bladder stone  The various methods of treatment have been discussed with the patient and family. After consideration of risks, benefits and other options for treatment, the patient has consented to  Procedure(s): CYSTOSCOPY WITH LITHOLAPAXY (N/A) GREEN LIGHT LASER TURP (TRANSURETHRAL RESECTION OF PROSTATE (N/A) as a surgical intervention .  The patient's history has been reviewed, patient examined, no change in status, stable for surgery.  I have reviewed the patient's chart and labs.  Questions were answered to the patient's satisfaction.     Robert Miller I  Plan: Use Green Light laser to create trench at 5 and 7 o'clock, lase L lateral lobe, and remove imbedded R bladder neck stone. ( 1cm).

## 2012-05-26 NOTE — Anesthesia Postprocedure Evaluation (Signed)
Anesthesia Post Note  Patient: Robert Miller  Procedure(s) Performed: Procedure(s) (LRB): CYSTOSCOPY WITH LITHOLAPAXY (N/A) GREEN LIGHT LASER TURP (TRANSURETHRAL RESECTION OF PROSTATE (N/A)  Anesthesia type: General  Patient location: PACU  Post pain: Pain level controlled  Post assessment: Post-op Vital signs reviewed  Last Vitals:  Filed Vitals:   05/26/12 1015  BP: 148/79  Pulse: 57  Temp:   Resp: 12    Post vital signs: Reviewed  Level of consciousness: sedated  Complications: No apparent anesthesia complications

## 2012-05-26 NOTE — Transfer of Care (Signed)
Immediate Anesthesia Transfer of Care Note  Patient: Robert Miller  Procedure(s) Performed: Procedure(s) (LRB): CYSTOSCOPY WITH LITHOLAPAXY (N/A) GREEN LIGHT LASER TURP (TRANSURETHRAL RESECTION OF PROSTATE (N/A)  Patient Location: PACU  Anesthesia Type: General  Level of Consciousness: awake, oriented, sedated and patient cooperative  Airway & Oxygen Therapy: Patient Spontanous Breathing and Patient connected to face mask oxygen  Post-op Assessment: Report given to PACU RN and Post -op Vital signs reviewed and stable  Post vital signs: Reviewed and stable  Complications: No apparent anesthesia complications

## 2012-05-26 NOTE — Anesthesia Procedure Notes (Signed)
Procedure Name: LMA Insertion Date/Time: 05/26/2012 9:44 AM Performed by: Renella Cunas D Pre-anesthesia Checklist: Patient identified, Emergency Drugs available, Suction available and Patient being monitored Patient Re-evaluated:Patient Re-evaluated prior to inductionOxygen Delivery Method: Circle System Utilized Preoxygenation: Pre-oxygenation with 100% oxygen Intubation Type: IV induction Ventilation: Mask ventilation without difficulty LMA: LMA inserted LMA Size: 4.0 Number of attempts: 1 Airway Equipment and Method: bite block Placement Confirmation: positive ETCO2 Tube secured with: Tape Dental Injury: Teeth and Oropharynx as per pre-operative assessment

## 2012-05-27 ENCOUNTER — Encounter (HOSPITAL_BASED_OUTPATIENT_CLINIC_OR_DEPARTMENT_OTHER): Payer: Self-pay | Admitting: Urology

## 2013-02-25 ENCOUNTER — Other Ambulatory Visit: Payer: Self-pay | Admitting: Family Medicine

## 2013-02-25 DIAGNOSIS — Z136 Encounter for screening for cardiovascular disorders: Secondary | ICD-10-CM

## 2013-03-02 ENCOUNTER — Ambulatory Visit
Admission: RE | Admit: 2013-03-02 | Discharge: 2013-03-02 | Disposition: A | Discharge status: Home / Self Care | Payer: Medicare Other | Source: Ambulatory Visit | Attending: Family Medicine | Admitting: Family Medicine

## 2013-03-02 DIAGNOSIS — Z136 Encounter for screening for cardiovascular disorders: Secondary | ICD-10-CM

## 2013-10-30 ENCOUNTER — Encounter: Payer: Self-pay | Admitting: *Deleted

## 2014-02-13 IMAGING — US US AORTA SCREENING (MEDICARE)
1 series · 14 of 18 positions shown · non-contrast
Comparison: None.

CLINICAL DATA: History of tobacco use, abdominal aortic aneurysm
screening.

EXAM:
ABDOMINAL AORTA SCREENING ULTRASOUND
TECHNIQUE: Ultrasound examination of the abdominal aorta was performed as a
screening evaluation for abdominal aortic aneurysm.

[Series 1: us aorta screening (medicare) · 0.33mm/px · 14 of 18 slices shown]
[im 1/18]
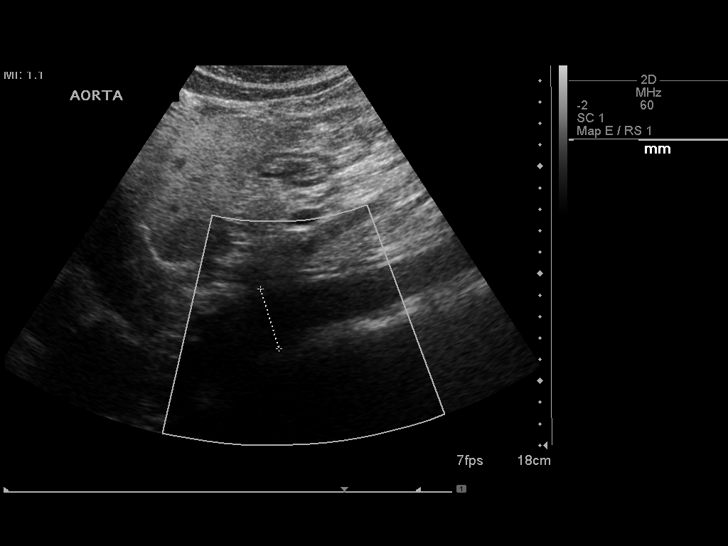
[im 2/18]
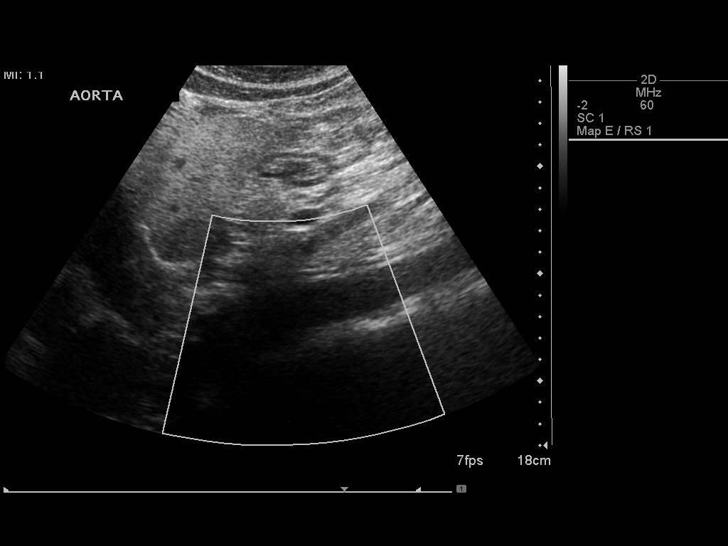
[im 4/18]
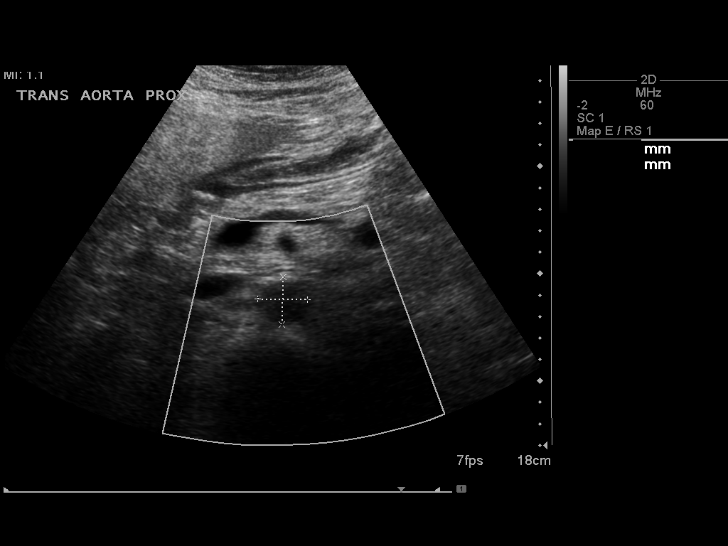
[im 5/18]
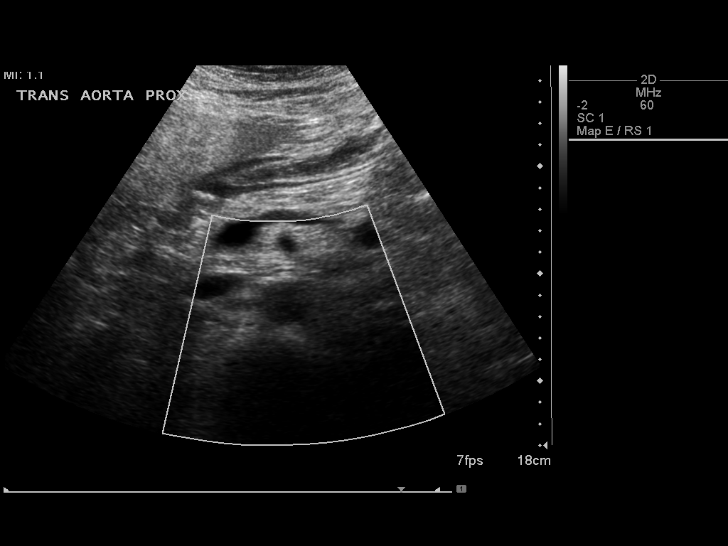
[im 6/18]
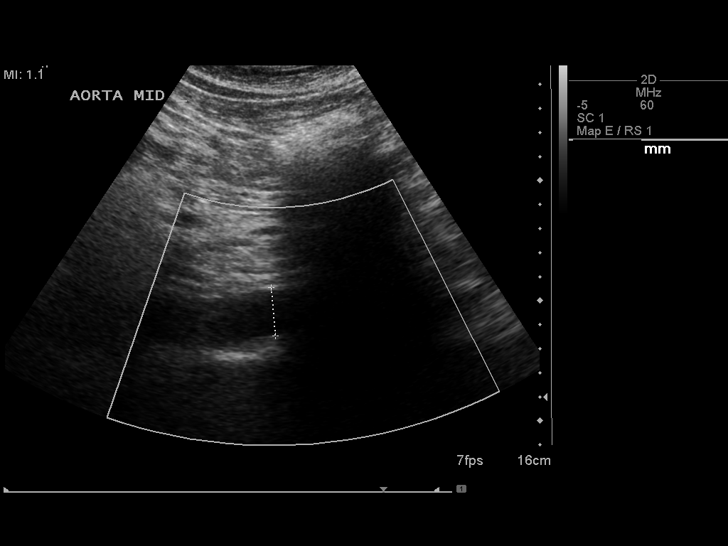
[im 8/18]
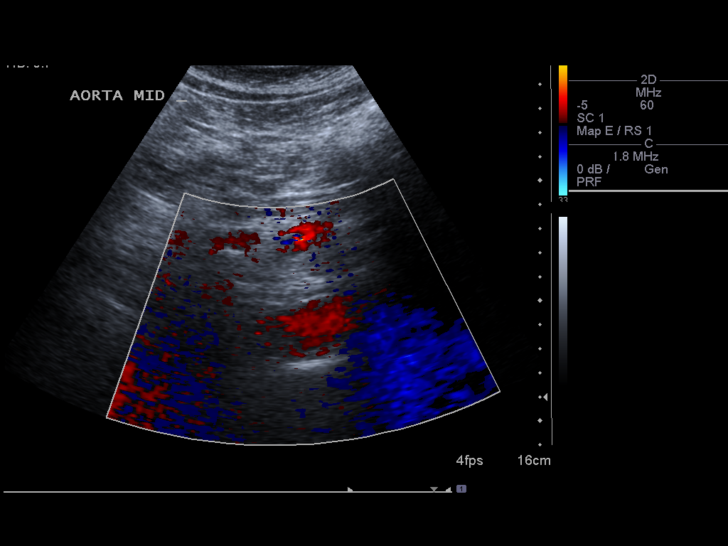
[im 9/18]
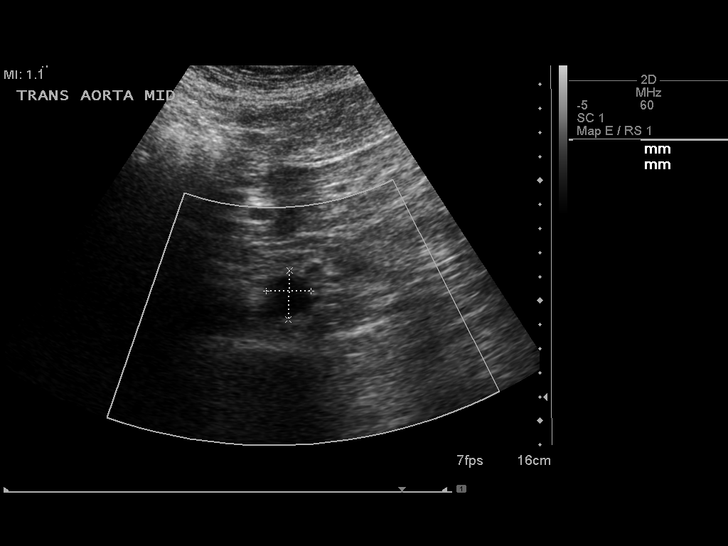
[im 10/18]
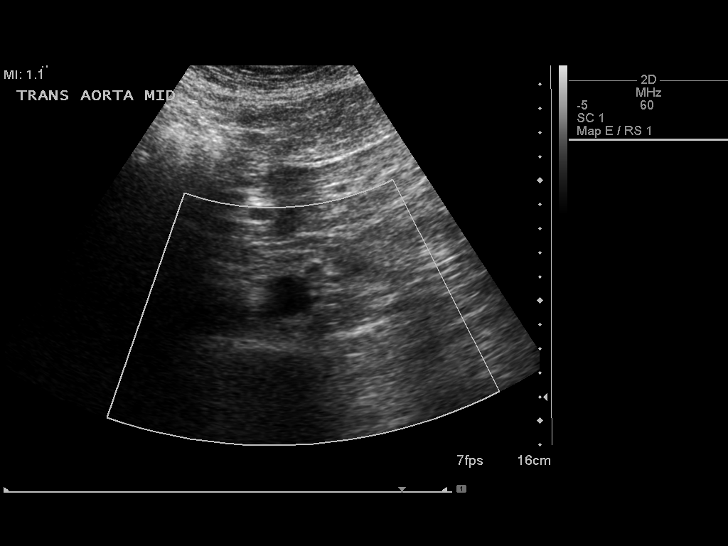
[im 11/18]
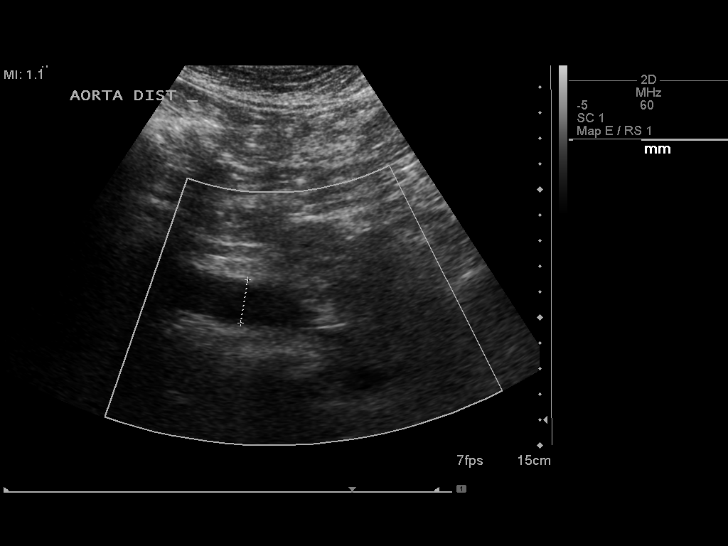
[im 13/18]
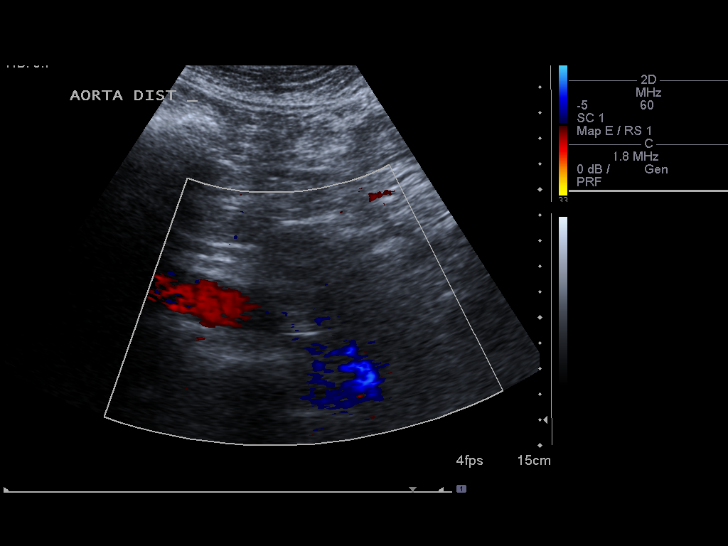
[im 14/18]
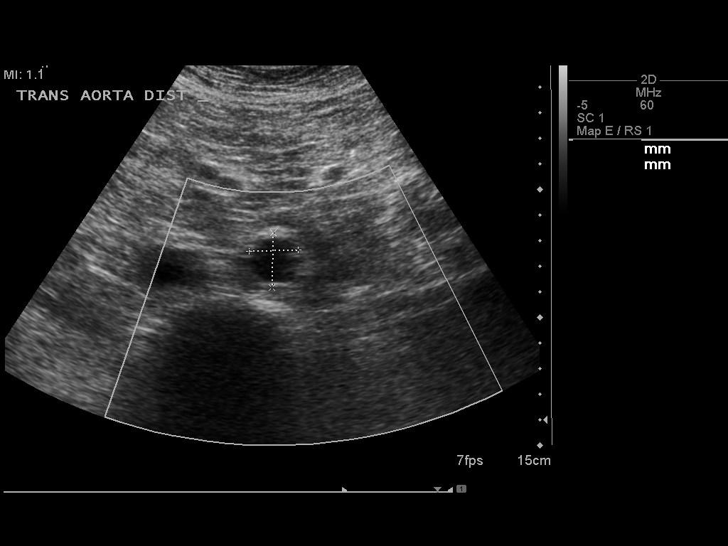
[im 15/18]
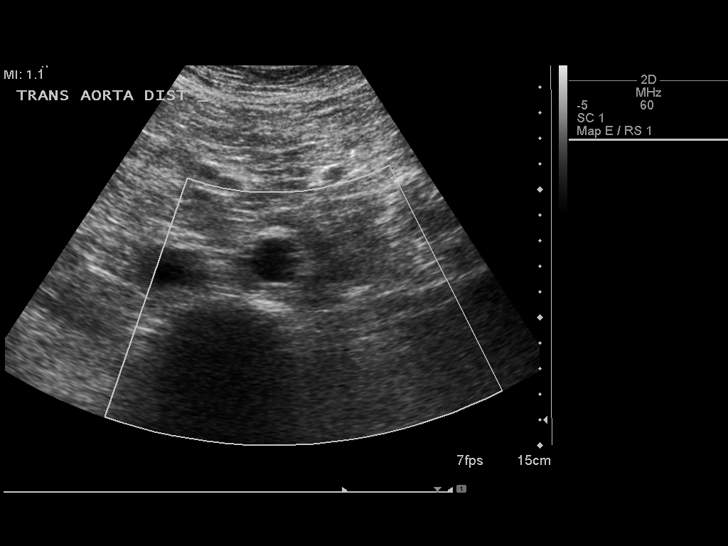
[im 17/18]
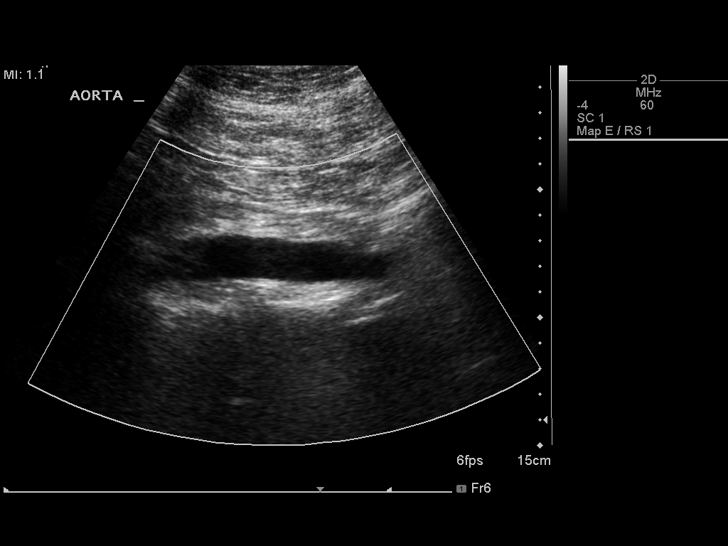
[im 18/18]
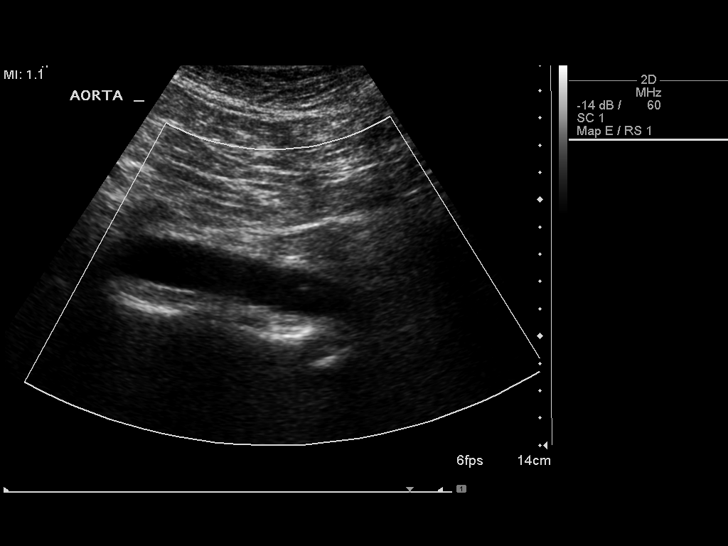

[14 of 18 positions shown; findings below may reference images not displayed]

FINDINGS: Abdominal Aorta: No aneurysm identified.

Maximum AP diameter:  2.9 cm

Maximum TRV diameter:  2.3 cm
IMPRESSION: No evidence of abdominal aortic aneurysm.

## 2014-02-15 ENCOUNTER — Other Ambulatory Visit: Payer: Self-pay | Admitting: Urology

## 2014-02-25 ENCOUNTER — Encounter (HOSPITAL_BASED_OUTPATIENT_CLINIC_OR_DEPARTMENT_OTHER): Payer: Self-pay | Admitting: *Deleted

## 2014-02-25 NOTE — Progress Notes (Addendum)
NPO AFTER MN. ARRIVE AT 0915. NEEDS ISTAT AND EKG. WILL TAKE GEMFIBROZIL AM DOS W/ SIPS OF WATER.

## 2014-03-08 ENCOUNTER — Ambulatory Visit (HOSPITAL_BASED_OUTPATIENT_CLINIC_OR_DEPARTMENT_OTHER): Payer: Medicare Other | Admitting: Anesthesiology

## 2014-03-08 ENCOUNTER — Encounter (HOSPITAL_BASED_OUTPATIENT_CLINIC_OR_DEPARTMENT_OTHER): Payer: Self-pay

## 2014-03-08 ENCOUNTER — Ambulatory Visit (HOSPITAL_BASED_OUTPATIENT_CLINIC_OR_DEPARTMENT_OTHER)
Admission: RE | Admit: 2014-03-08 | Discharge: 2014-03-08 | Disposition: A | Discharge status: Home / Self Care | Payer: Medicare Other | Source: Ambulatory Visit | Attending: Urology | Admitting: Urology

## 2014-03-08 ENCOUNTER — Encounter (HOSPITAL_BASED_OUTPATIENT_CLINIC_OR_DEPARTMENT_OTHER)
Admission: RE | Disposition: A | Discharge status: Home / Self Care | Payer: Self-pay | Source: Ambulatory Visit | Attending: Urology

## 2014-03-08 DIAGNOSIS — Z8546 Personal history of malignant neoplasm of prostate: Secondary | ICD-10-CM | POA: Insufficient documentation

## 2014-03-08 DIAGNOSIS — Z87891 Personal history of nicotine dependence: Secondary | ICD-10-CM | POA: Diagnosis not present

## 2014-03-08 DIAGNOSIS — Z8249 Family history of ischemic heart disease and other diseases of the circulatory system: Secondary | ICD-10-CM | POA: Insufficient documentation

## 2014-03-08 DIAGNOSIS — N210 Calculus in bladder: Secondary | ICD-10-CM | POA: Diagnosis not present

## 2014-03-08 DIAGNOSIS — R319 Hematuria, unspecified: Secondary | ICD-10-CM | POA: Diagnosis not present

## 2014-03-08 DIAGNOSIS — K625 Hemorrhage of anus and rectum: Secondary | ICD-10-CM | POA: Diagnosis not present

## 2014-03-08 DIAGNOSIS — Z86711 Personal history of pulmonary embolism: Secondary | ICD-10-CM | POA: Insufficient documentation

## 2014-03-08 DIAGNOSIS — Z818 Family history of other mental and behavioral disorders: Secondary | ICD-10-CM | POA: Diagnosis not present

## 2014-03-08 DIAGNOSIS — E780 Pure hypercholesterolemia: Secondary | ICD-10-CM | POA: Insufficient documentation

## 2014-03-08 DIAGNOSIS — Z823 Family history of stroke: Secondary | ICD-10-CM | POA: Diagnosis not present

## 2014-03-08 DIAGNOSIS — C61 Malignant neoplasm of prostate: Secondary | ICD-10-CM | POA: Diagnosis not present

## 2014-03-08 DIAGNOSIS — Z87442 Personal history of urinary calculi: Secondary | ICD-10-CM | POA: Insufficient documentation

## 2014-03-08 HISTORY — DX: Personal history of colon polyps, unspecified: Z86.0100

## 2014-03-08 HISTORY — PX: CYSTOSCOPY: SHX5120

## 2014-03-08 HISTORY — PX: TRANSURETHRAL RESECTION OF PROSTATE: SHX73

## 2014-03-08 HISTORY — PX: HOLMIUM LASER APPLICATION: SHX5852

## 2014-03-08 HISTORY — DX: Personal history of other diseases of urinary system: Z87.448

## 2014-03-08 HISTORY — DX: Nocturia: R35.1

## 2014-03-08 HISTORY — DX: Hyperlipidemia, unspecified: E78.5

## 2014-03-08 HISTORY — DX: Personal history of other diseases of the circulatory system: Z86.79

## 2014-03-08 HISTORY — DX: Personal history of other diseases of the digestive system: Z87.19

## 2014-03-08 HISTORY — DX: Personal history of pulmonary embolism: Z86.711

## 2014-03-08 HISTORY — DX: Personal history of other venous thrombosis and embolism: Z86.718

## 2014-03-08 HISTORY — DX: Personal history of colonic polyps: Z86.010

## 2014-03-08 HISTORY — DX: Personal history of irradiation: Z92.3

## 2014-03-08 HISTORY — DX: Other specified disorders of prostate: N42.89

## 2014-03-08 LAB — POCT I-STAT 4, (NA,K, GLUC, HGB,HCT)
GLUCOSE: 105 mg/dL — AB (ref 70–99)
HEMATOCRIT: 38 % — AB (ref 39.0–52.0)
Hemoglobin: 12.9 g/dL — ABNORMAL LOW (ref 13.0–17.0)
Potassium: 3.9 mmol/L (ref 3.5–5.1)
Sodium: 139 mmol/L (ref 135–145)

## 2014-03-08 SURGERY — CYSTOSCOPY
Anesthesia: General | Site: Prostate

## 2014-03-08 MED ORDER — CEFAZOLIN SODIUM-DEXTROSE 2-3 GM-% IV SOLR
2.0000 g | INTRAVENOUS | Status: AC
  Administered 2014-03-08: 2 g via INTRAVENOUS
  Filled 2014-03-08: qty 50

## 2014-03-08 MED ORDER — OXYCODONE-ACETAMINOPHEN 5-325 MG PO TABS: 1.0000 | ORAL_TABLET | ORAL | Status: DC | PRN

## 2014-03-08 MED ORDER — FENTANYL CITRATE 0.05 MG/ML IJ SOLN
INTRAMUSCULAR | Status: AC
  Filled 2014-03-08: qty 4

## 2014-03-08 MED ORDER — LIDOCAINE HCL (CARDIAC) 20 MG/ML IV SOLN
INTRAVENOUS | Status: DC | PRN
Start: 2014-03-08 — End: 2014-03-08
  Administered 2014-03-08: 80 mg via INTRAVENOUS

## 2014-03-08 MED ORDER — BELLADONNA ALKALOIDS-OPIUM 16.2-60 MG RE SUPP
RECTAL | Status: AC
  Filled 2014-03-08: qty 1

## 2014-03-08 MED ORDER — PROMETHAZINE HCL 25 MG/ML IJ SOLN
6.2500 mg | INTRAMUSCULAR | Status: DC | PRN
  Filled 2014-03-08: qty 1

## 2014-03-08 MED ORDER — CEFAZOLIN SODIUM-DEXTROSE 2-3 GM-% IV SOLR
INTRAVENOUS | Status: AC
  Filled 2014-03-08: qty 50

## 2014-03-08 MED ORDER — FENTANYL CITRATE 0.05 MG/ML IJ SOLN
25.0000 ug | INTRAMUSCULAR | Status: DC | PRN
  Filled 2014-03-08: qty 1

## 2014-03-08 MED ORDER — PROPOFOL 10 MG/ML IV BOLUS
INTRAVENOUS | Status: DC | PRN
  Administered 2014-03-08: 170 mg via INTRAVENOUS

## 2014-03-08 MED ORDER — ONDANSETRON HCL 4 MG/2ML IJ SOLN
INTRAMUSCULAR | Status: DC | PRN
  Administered 2014-03-08: 4 mg via INTRAVENOUS

## 2014-03-08 MED ORDER — SODIUM CHLORIDE 0.9 % IR SOLN
Status: DC | PRN
  Administered 2014-03-08: 12000 mL

## 2014-03-08 MED ORDER — LACTATED RINGERS IV SOLN
INTRAVENOUS | Status: DC | PRN
  Administered 2014-03-08 (×2): via INTRAVENOUS

## 2014-03-08 MED ORDER — ACETAMINOPHEN 10 MG/ML IV SOLN
INTRAVENOUS | Status: DC | PRN
  Administered 2014-03-08: 1000 mg via INTRAVENOUS

## 2014-03-08 MED ORDER — URIBEL 118 MG PO CAPS: 1.0000 | ORAL_CAPSULE | Freq: Two times a day (BID) | ORAL | Status: DC | PRN

## 2014-03-08 MED ORDER — DEXAMETHASONE SODIUM PHOSPHATE 10 MG/ML IJ SOLN
INTRAMUSCULAR | Status: DC | PRN
  Administered 2014-03-08: 10 mg via INTRAVENOUS

## 2014-03-08 MED ORDER — LACTATED RINGERS IV SOLN
INTRAVENOUS | Status: DC
  Administered 2014-03-08: 10:00:00 via INTRAVENOUS
  Filled 2014-03-08: qty 1000

## 2014-03-08 MED ORDER — BELLADONNA ALKALOIDS-OPIUM 16.2-60 MG RE SUPP
RECTAL | Status: DC | PRN
  Administered 2014-03-08: 1 via RECTAL

## 2014-03-08 MED ORDER — MEPERIDINE HCL 25 MG/ML IJ SOLN
6.2500 mg | INTRAMUSCULAR | Status: DC | PRN
  Filled 2014-03-08: qty 1

## 2014-03-08 MED ORDER — FENTANYL CITRATE 0.05 MG/ML IJ SOLN
INTRAMUSCULAR | Status: DC | PRN
  Administered 2014-03-08 (×3): 25 ug via INTRAVENOUS
  Administered 2014-03-08: 50 ug via INTRAVENOUS
  Administered 2014-03-08: 25 ug via INTRAVENOUS

## 2014-03-08 MED ORDER — KETOROLAC TROMETHAMINE 30 MG/ML IJ SOLN
INTRAMUSCULAR | Status: DC | PRN
  Administered 2014-03-08: 30 mg via INTRAVENOUS

## 2014-03-08 SURGICAL SUPPLY — 57 items
BAG DRAIN URO-CYSTO SKYTR STRL (DRAIN) ×4 IMPLANT
BAG DRN ANRFLXCHMBR STRAP LEK (BAG)
BAG DRN UROCATH (DRAIN) ×2
BAG URINE DRAINAGE (UROLOGICAL SUPPLIES) ×2 IMPLANT
BAG URINE LEG 19OZ MD ST LTX (BAG) IMPLANT
BLADE SURG 15 STRL LF DISP TIS (BLADE) IMPLANT
BLADE SURG 15 STRL SS (BLADE)
BOOTIES KNEE HIGH SLOAN (MISCELLANEOUS) ×4 IMPLANT
CANISTER SUCT LVC 12 LTR MEDI- (MISCELLANEOUS) ×12 IMPLANT
CATH AINSWORTH 30CC 24FR (CATHETERS) IMPLANT
CATH FOLEY 2WAY SLVR  5CC 14FR (CATHETERS)
CATH FOLEY 2WAY SLVR  5CC 20FR (CATHETERS)
CATH FOLEY 2WAY SLVR 5CC 14FR (CATHETERS) IMPLANT
CATH FOLEY 2WAY SLVR 5CC 20FR (CATHETERS) IMPLANT
CATH HEMA 3WAY 30CC 22FR COUDE (CATHETERS) ×2 IMPLANT
CATH HEMA 3WAY 30CC 24FR COUDE (CATHETERS) IMPLANT
CATH HEMA 3WAY 30CC 24FR RND (CATHETERS) IMPLANT
CATH SIMPLASTIC 24 30ML (CATHETERS) IMPLANT
CLOTH BEACON ORANGE TIMEOUT ST (SAFETY) ×4 IMPLANT
DRAPE CAMERA CLOSED 9X96 (DRAPES) ×4 IMPLANT
ELECT BUTTON HF 24-28F 2 30DE (ELECTRODE) ×4 IMPLANT
ELECT LOOP HF 24-28F (CUTTING LOOP) IMPLANT
ELECT LOOP MED HF 24F 12D CBL (CLIP) ×2 IMPLANT
ELECT NEEDLE 45D HF 24-28F 12D (CUTTING LOOP) IMPLANT
ELECT REM PT RETURN 9FT ADLT (ELECTROSURGICAL)
ELECTRODE REM PT RTRN 9FT ADLT (ELECTROSURGICAL) ×2 IMPLANT
FIBER LASER FLEXIVA 1000 (UROLOGICAL SUPPLIES) ×2 IMPLANT
FIBER LASER FLEXIVA 550 (UROLOGICAL SUPPLIES) ×2 IMPLANT
GLOVE BIO SURGEON STRL SZ 6.5 (GLOVE) ×1 IMPLANT
GLOVE BIO SURGEON STRL SZ7.5 (GLOVE) ×4 IMPLANT
GLOVE BIO SURGEONS STRL SZ 6.5 (GLOVE) ×1
GLOVE BIOGEL PI IND STRL 6.5 (GLOVE) IMPLANT
GLOVE BIOGEL PI INDICATOR 6.5 (GLOVE) ×4
GOWN PREVENTION PLUS LG XLONG (DISPOSABLE) ×2 IMPLANT
GOWN STRL REIN XL XLG (GOWN DISPOSABLE) ×2 IMPLANT
GOWN STRL REUS W/TWL LRG LVL3 (GOWN DISPOSABLE) ×2 IMPLANT
GOWN STRL REUS W/TWL XL LVL3 (GOWN DISPOSABLE) ×2 IMPLANT
HOLDER FOLEY CATH W/STRAP (MISCELLANEOUS) IMPLANT
IV NS 1000ML (IV SOLUTION)
IV NS 1000ML BAXH (IV SOLUTION) IMPLANT
IV NS IRRIG 3000ML ARTHROMATIC (IV SOLUTION) ×8 IMPLANT
LOOP CUTTING 24FR OLYMPUS (CUTTING LOOP) IMPLANT
NDL SAFETY ECLIPSE 18X1.5 (NEEDLE) IMPLANT
NDL SPNL 22GX7 QUINCKE BK (NEEDLE) IMPLANT
NEEDLE HYPO 18GX1.5 SHARP (NEEDLE)
NEEDLE HYPO 22GX1.5 SAFETY (NEEDLE) IMPLANT
NEEDLE SPNL 22GX7 QUINCKE BK (NEEDLE) IMPLANT
NS IRRIG 500ML POUR BTL (IV SOLUTION) IMPLANT
PACK CYSTO (CUSTOM PROCEDURE TRAY) ×4 IMPLANT
PLUG CATH AND CAP STER (CATHETERS) ×2 IMPLANT
SET ASPIRATION TUBING (TUBING) ×2 IMPLANT
SUT ETHILON 3 0 PS 1 (SUTURE) IMPLANT
SUT SILK 0 TIES 10X30 (SUTURE) IMPLANT
SYR 20CC LL (SYRINGE) IMPLANT
SYR 30ML LL (SYRINGE) ×2 IMPLANT
SYRINGE IRR TOOMEY STRL 70CC (SYRINGE) ×4 IMPLANT
WATER STERILE IRR 3000ML UROMA (IV SOLUTION) ×2 IMPLANT

## 2014-03-08 NOTE — Anesthesia Procedure Notes (Signed)
Procedure Name: LMA Insertion Date/Time: 03/08/2014 11:37 AM Performed by: Justice Rocher Pre-anesthesia Checklist: Patient identified, Emergency Drugs available, Suction available and Patient being monitored Patient Re-evaluated:Patient Re-evaluated prior to inductionOxygen Delivery Method: Circle System Utilized Preoxygenation: Pre-oxygenation with 100% oxygen Intubation Type: IV induction Ventilation: Mask ventilation without difficulty LMA: LMA inserted LMA Size: 5.0 Number of attempts: 1 Airway Equipment and Method: bite block Placement Confirmation: positive ETCO2 Tube secured with: Tape Dental Injury: Teeth and Oropharynx as per pre-operative assessment

## 2014-03-08 NOTE — Transfer of Care (Signed)
Immediate Anesthesia Transfer of Care Note  Patient: Robert Miller  Procedure(s) Performed: Procedure(s) (LRB): CYSTOSCOPY (N/A) HOLMIUM LASER OF PROSTATIC STONES  (N/A) TRANSURETHRAL RESECTION OF THE PROSTATE (TURP) WITH GYRUS  (N/A)  Patient Location: PACU  Anesthesia Type: General  Level of Consciousness: awake, alert  and oriented  Airway & Oxygen Therapy: Patient Spontanous Breathing and Patient connected to face mask oxygen  Post-op Assessment: Report given to PACU RN and Post -op Vital signs reviewed and stable  Post vital signs: Reviewed and stable  Complications: No apparent anesthesia complications

## 2014-03-08 NOTE — Discharge Instructions (Addendum)
Transurethral Procedure  Medications: Resume all your other meds from home  Activity: 1. No heavy lifting > 10 pounds for 2 weeks. 2. No sexual activity for 2 weeks. 3. No strenuous activity for 2 weeks. 4. No driving while on narcotic pain medications. 5. Drink plenty of water. 6. Continue to walk at home - you can still get blood clots when you are at home so keep     active but don't over do it. 7. Your urine may have some blood in it - make sure you drink plenty of water. Call or           come to the ER immediately if you catheter stops draining or you are unable to urinate.  Foley Catheter ( if you go home with a catheter in place ) 1. Make sure your catheter is attached to your leg at all times - do not let anything pull on      it. 2. If the urine in your tube starts looking dark red or if it stops draining, call us immediately    or come to the ER. 3. Drink plenty of water. If you notice your urine looking dark, sit down, relax and drink         lots of water. 4. You will be given a leg bag as well as an overnight bag for your catheter- MAKE SURE CATHETER IS ATTACHED TO YOUR LEG AT ALL TIMES WITH TAPE OR LEG STRAP  Bathing: You can shower. You can take a bath unless you have a foley catheter in place.  Signs / Symptoms to call: 1. Call if you have a fever greater than 101.5  2. Uncontrolled nausea / vomiting, uncontrolled pain / dizziness, unable to urinate, leg         swelling / leg pain, or any other concerns.   You can reach [place] at [place] Instructions  Activity: Get plenty of rest for the remainder of the day. A responsible adult should stay with you for 24 hours following the procedure.  For the next 24 hours, DO NOT: -Drive a car -Paediatric nurse -Drink alcoholic beverages -Take any medication unless instructed by your physician -Make any legal decisions or sign important  papers.  Meals: Start with liquid foods such as gelatin or soup. Progress to regular foods as tolerated. Avoid greasy, spicy, heavy foods. If nausea and/or vomiting occur, drink only clear liquids until the nausea and/or vomiting subsides. Call your physician if vomiting continues.  Special Instructions/Symptoms: Your throat may feel dry or sore from the anesthesia or the breathing tube placed in your throat during surgery. If this causes discomfort, gargle with warm salt water. The discomfort should disappear within 24 hours.

## 2014-03-08 NOTE — Anesthesia Postprocedure Evaluation (Signed)
  Anesthesia Post-op Note  Patient: Robert Miller  Procedure(s) Performed: Procedure(s) (LRB): CYSTOSCOPY (N/A) HOLMIUM LASER OF PROSTATIC STONES  (N/A) TRANSURETHRAL RESECTION OF THE PROSTATE (TURP) WITH GYRUS  (N/A)  Patient Location: PACU  Anesthesia Type: General  Level of Consciousness: awake and alert   Airway and Oxygen Therapy: Patient Spontanous Breathing  Post-op Pain: mild  Post-op Assessment: Post-op Vital signs reviewed, Patient's Cardiovascular Status Stable, Respiratory Function Stable, Patent Airway and No signs of Nausea or vomiting  Last Vitals:  Filed Vitals:   03/08/14 1315  BP: 116/67  Pulse: 63  Temp:   Resp: 12    Post-op Vital Signs: stable   Complications: No apparent anesthesia complications

## 2014-03-08 NOTE — Op Note (Signed)
Pre-operative diagnosis : Prostatic calcification with stones, post external beam radiation therapy for adenocarcinoma prostate(with IMRT in 2012)  Postoperative diagnosis:  Same  Operation: Cystourethroscopy, transurethral resection of prostate with stone extraction, holmium laser of prostatic calculi.    Surgeon:  Chauncey Cruel. Gaynelle Arabian, MD  First assistant: None    Anesthesia  : Gen. LMA   Preparation: After appropriate premedication, the patient was brought the operative room, placed on the operating table in the dorsal supine position where general LMA anesthesia was introduced. He was then replaced in the dorsal lithotomy position with pubis was prepped with Betadine solution and draped in usual fashion. The history was reviewed. The armband was double checked.     Review history:   73 YO male returns today for cystoscopy to look for prostatic stones. Hx of bladder stones & passed stones in Sept 2015. He was last seen by Jimmey Ralph, NP on 10/21/13 for an overdue 6 month office visit. In April 2015, had weak stream with frequency but a few days later was able to pass some stone fragments and voiding issues resolved. He is continuing to pass stones intermittently. He is able to urinate without too much difficulty.    Hx of intermittent gross hematuria up until Sept 2014. In Oct, he started noticing that he would have a weak stream & then he would pass something (? stone).     S/P Green light procedure & cystolithopaxy on 05/26/12. He states that he has frequency, mixed incontinence, but a good stream. He has passed some prostatic stones and some necrotic tissue.     Hx intermittent gross hematuria and blood in stools X 2 months. Cystoscopy on 04/23/12 showed a bladder stone & bladder trabeculation. He saw Dr. Penelope Coop in reference to rectal bleeding & had a colonoscopy on 04/02/12. Does have h/o tobacco use but none in >47 yrs. H/O chemical exposure (worked in [employer]) and IMRT X 40 sessions  2012-13.     Hx of prostate cancer. S/P TUR-P for urinary retention on 05/09/09, with path showing G 3+3=6 CaP, <2% of the tissue.     S/P repeat repair ventral hernia in Oct. 2011 & had some po complications of pulmonary embolus.     S/P prostate biopsy in Oct. 2012, has 1/12 biopsies + for G 4+3=7 CaP in the R lateral base, and HgPin in the R lateral apex. He had external beam/IMRT from 02/21/11 thru 04/20/11 with 40 sessions.    08/17/13 PSA - 0.58  Statement of  Likelihood of Success: Excellent. TIME-OUT observed.:  Procedure:  Cystourethroscopy was a Compass, showing prostatic calcification with multiple stones intubated within the prostate bilaterally, encompassing the entire lateral lobes extending from the bladder neck to the Vero. Transurethral resection of the prostate was a Compass, with dislodging of the stones into the bladder. However, the loop was noted to break, this was removed. I then placed the continuous flow scope with the cystoscope, and the holmium laser was employed, with the 550 fiber, with 1.0 J of energy, at 15 Hz (rate), and [address] is power impresses. I was able to fractionate the stones in the bladder, and fractionate most of the stones in the prostate. However, the entire prostate appears to be intubated with stones. 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  Stones were evacuated from the bladder, and a size 22 hematuria catheter was passed with 30 mL in the balloon. No traction and no CBI was necessary. The patient was awakened  after receiving IV Tylenol and IV Toradol. He was taken to recovery room in good condition.

## 2014-03-08 NOTE — Anesthesia Preprocedure Evaluation (Signed)
Anesthesia Evaluation  Patient identified by MRN, date of birth, ID band Patient awake    Reviewed: Allergy & Precautions, H&P , NPO status , Patient's Chart, lab work & pertinent test results  Airway Mallampati: II  TM Distance: >3 FB Neck ROM: Full    Dental no notable dental hx.    Pulmonary former smoker, PE breath sounds clear to auscultation  Pulmonary exam normal       Cardiovascular DVT + dysrhythmias Atrial Fibrillation Rhythm:Regular Rate:Normal     Neuro/Psych negative neurological ROS  negative psych ROS   GI/Hepatic negative GI ROS, Neg liver ROS,   Endo/Other  negative endocrine ROS  Renal/GU negative Renal ROS  negative genitourinary   Musculoskeletal negative musculoskeletal ROS (+)   Abdominal   Peds negative pediatric ROS (+)  Hematology negative hematology ROS (+)   Anesthesia Other Findings   Reproductive/Obstetrics negative OB ROS                             Anesthesia Physical Anesthesia Plan  ASA: III  Anesthesia Plan: General   Post-op Pain Management:    Induction: Intravenous  Airway Management Planned: LMA  Additional Equipment:   Intra-op Plan:   Post-operative Plan: Extubation in OR  Informed Consent: I have reviewed the patients History and Physical, chart, labs and discussed the procedure including the risks, benefits and alternatives for the proposed anesthesia with the patient or authorized representative who has indicated his/her understanding and acceptance.   Dental advisory given  Plan Discussed with: CRNA  Anesthesia Plan Comments:         Anesthesia Quick Evaluation

## 2014-03-08 NOTE — H&P (Signed)
Reason For Visit Cystoscopy   Active Problems Problems  1. Bladder calculus (N21.0)   Assessed By: Carolan Clines (Urology); Last Assessed: 09 Feb 2014 2. Prostate cancer (C61)   Assessed By: Carolan Clines (Urology); Last Assessed: 09 Feb 2014   External beam/IMRT from 02/21/11 thru 04/20/11 total 40 sessions  History of Present Illness     73 YO male returns today for cystoscopy to look for prostatic stones. Hx of bladder stones & passed stones in Sept 2015. He was last seen by Jimmey Ralph, NP on 10/21/13 for an overdue 6 month office visit. In April 2015, had weak stream with frequency but a few days later was able to pass some stone fragments and voiding issues resolved. He is continuing to pass stones intermittently. He is able to urinate without too much difficulty.    Hx of intermittent gross hematuria up until Sept 2014. In Oct, he started noticing that he would have a weak stream & then he would pass something (? stone).     S/P Green light procedure & cystolithopaxy on 05/26/12. He states that he has frequency, mixed incontinence, but a good stream. He has passed some prostatic stones and some necrotic tissue.     Hx intermittent gross hematuria and blood in stools X 2 months. Cystoscopy on 04/23/12 showed a bladder stone & bladder trabeculation. He saw Dr. Penelope Coop in reference to rectal bleeding & had a colonoscopy on 04/02/12. Does have h/o tobacco use but none in >47 yrs. H/O chemical exposure (worked in [employer]) and IMRT X 40 sessions 2012-13.     Hx of prostate cancer. S/P TUR-P for urinary retention on 05/09/09, with path showing G 3+3=6 CaP, <2% of the tissue.       S/P repeat repair ventral hernia in Oct. 2011 & had some po complications of pulmonary embolus.     S/P prostate biopsy in Oct. 2012, has 1/12 biopsies + for G 4+3=7 CaP in the R lateral base, and HgPin in the R lateral apex. He had external beam/IMRT from 02/21/11 thru 04/20/11 with 40  sessions.    08/17/13 PSA - 0.58  02/09/13 PSA - 0.77  02/11/12: PSA- 0.79  08/09/11 PSA - 1.11   11/15/10 PSA - 2.67   Past Medical History Problems  1. History of hypercholesterolemia (Z86.39) 2. History of Pulmonary Embolism  Surgical History Problems  1. History of Appendectomy 2. History of Cataract Surgery 3. History of Cystoscopy With Fragmentation Of Bladder Calculus 4. History of Laser Vaporization With Transurethral Resection Of Prostate 5. History of Transurethral Resection Of Prostate (TURP) 6. History of Ventral Hernia Repair 7. History of Ventral Hernia Repair  Current Meds 1. Aspirin 81 MG Oral Tablet;  Therapy: (Recorded:18Sep2012) to Recorded 2. Cinnamon CAPS;  Therapy: (Recorded:17Jan2011) to Recorded 3. CoQ10 CAPS;  Therapy: (Recorded:17Jan2011) to Recorded 4. Gemfibrozil 600 MG Oral Tablet;  Therapy: (Recorded:17Jan2011) to Recorded 5. MSM CAPS;  Therapy: (Recorded:17Jan2011) to Recorded 6. Multiple Vitamin TABS;  Therapy: (Recorded:17Jan2011) to Recorded 7. Omega-3 Plus CAPS;  Therapy: (Recorded:17Jan2011) to Recorded 8. Vitamin D3 TABS;  Therapy: (Recorded:18Sep2012) to Recorded  Allergies Medication  1. Sudafed 12 Hour TB12  Family History Problems  1. Family history of Acute Myocardial Infarction : Father 2. Family history of Family Health Status Number Of Children   2 sons, 2 daughters 3. Family history of Father Deceased At Age ____   40, heart attack 4. Family history of Mother Deceased At Age ____   34, stroke 5. Family  history of Stroke Syndrome : Sister 6. Family history of Stroke Syndrome : Mother  Social History Problems  1. Denied: History of Alcohol Use 2. Caffeine Use   3 per day 3. Former smoker (Z87.891)   1 ppd x 5 yrs, quit 43 yrs ago 4. Marital History - Currently Married 5. Occupation: Retired  Electronics engineer, constitutional, skin, eye, otolaryngeal, hematologic/lymphatic, cardiovascular,  pulmonary, endocrine, musculoskeletal, gastrointestinal, neurological and psychiatric system(s) were reviewed and pertinent findings if present are noted and are otherwise negative.    Vitals Vital Signs [Data Includes: Last 1 Day]  Recorded: [id number] 01:37PM  Blood Pressure: 154 / 93 Temperature: 97.8 F Heart Rate: 79  Physical Exam Constitutional: Well nourished and well developed . No acute distress.  ENT:. The ears and nose are normal in appearance.  Neck: The appearance of the neck is normal and no neck mass is present.  Pulmonary: No respiratory distress and normal respiratory rhythm and effort.  Cardiovascular: Heart rate and rhythm are normal . No peripheral edema.  Abdomen: The abdomen is soft and nontender. No masses are palpated. No CVA tenderness. No hernias are palpable. No hepatosplenomegaly noted.  Genitourinary: Examination of the penis demonstrates no discharge, no masses, no lesions and a normal meatus. The scrotum is without lesions. The right epididymis is palpably normal and non-tender. The left epididymis is palpably normal and non-tender. The right testis is non-tender and without masses. The left testis is non-tender and without masses.  Lymphatics: The femoral and inguinal nodes are not enlarged or tender.  Skin: Normal skin turgor, no visible rash and no visible skin lesions.  Neuro/Psych:. Mood and affect are appropriate.    Results/Data Urine [Data Includes: Last 1 Day]   [id number]  COLOR YELLOW   APPEARANCE CLEAR   SPECIFIC GRAVITY 1.020   pH 6.5   GLUCOSE NEG mg/dL  BILIRUBIN NEG   KETONE NEG mg/dL  BLOOD SMALL   PROTEIN TRACE mg/dL  UROBILINOGEN 0.2 mg/dL  NITRITE NEG   LEUKOCYTE ESTERASE MOD   SQUAMOUS EPITHELIAL/HPF RARE   WBC 3-6 WBC/hpf  RBC 7-10 RBC/hpf  BACTERIA RARE   CRYSTALS NONE SEEN   CASTS NONE SEEN    Procedure  Procedure: Cystoscopy  Chaperone Present: Berneice Gandy, CMA.  Indication: Hematuria. Lower Urinary Tract  Symptoms. Hx of bladder stones.  Informed Consent: Risks, benefits, and potential adverse events were discussed and informed consent was obtained from the patient.  Prep: The patient was prepped with betadine.  Anesthesia:. Local anesthesia was administered intraurethrally with 2% lidocaine jelly.  Antibiotic prophylaxis: Ciprofloxacin.  Procedure Note:  Urethral meatus:. No abnormalities.  Anterior urethra: No abnormalities.  Prostatic urethra:. There was visual obstruction of the prostatic urethra. A bladder neck contracture was present. Stones, necrotic tissue.  Bladder: Visulization was clear. The ureteral orifices were in the normal anatomic position bilaterally. A systematic survey of the bladder demonstrated no bladder tumors or stones. The patient tolerated the procedure well.  Complications: None.    Assessment Assessed  1. Bladder calculus (N21.0) 2. Prostate cancer (C61)  Multiple stones in the prostatic urethra. He will need Holmium laser of stones and TUR of necrotic tissue.   Plan Health Maintenance  1. UA With REFLEX; [Do Not Release]; Status:Resulted - Requires Verification;   Done:  [id number] 01:28PM Prostate cancer  2. Follow-up MD/NP/PA Office  Follow-up  Status: Hold For - Date of Service  Requested for:  [id number]  TURP and Holmium laser of prostatic stones. as op.  Signatures Electronically signed by : Carolan Clines, M.D.; Feb 09 2014  3:52PM EST

## 2014-03-08 NOTE — Interval H&P Note (Signed)
History and Physical Interval Note:  03/08/2014 11:33 AM  Robert Miller  has presented today for surgery, with the diagnosis of PROSTATE CANCER BLADDER CALCULUS  The various methods of treatment have been discussed with the patient and family. After consideration of risks, benefits and other options for treatment, the patient has consented to  Procedure(s): CYSTOSCOPY (N/A) HOLMIUM LASER OF PROSTATIC STONES  (N/A) TRANSURETHRAL RESECTION OF THE PROSTATE (TURP) WITH GYRUS  (N/A) as a surgical intervention .  The patient's history has been reviewed, patient examined, no change in status, stable for surgery.  I have reviewed the patient's chart and labs.  Questions were answered to the patient's satisfaction.     Carolan Clines I

## 2014-03-09 ENCOUNTER — Encounter (HOSPITAL_BASED_OUTPATIENT_CLINIC_OR_DEPARTMENT_OTHER): Payer: Self-pay | Admitting: Urology

## 2015-06-09 ENCOUNTER — Other Ambulatory Visit: Payer: Self-pay | Admitting: Family Medicine

## 2017-02-26 ENCOUNTER — Other Ambulatory Visit: Payer: Self-pay | Admitting: Family Medicine

## 2017-02-26 DIAGNOSIS — R911 Solitary pulmonary nodule: Secondary | ICD-10-CM

## 2017-03-01 ENCOUNTER — Ambulatory Visit
Admission: RE | Admit: 2017-03-01 | Discharge: 2017-03-01 | Disposition: A | Discharge status: Home / Self Care | Payer: Medicare Other | Source: Ambulatory Visit | Attending: Family Medicine | Admitting: Family Medicine

## 2017-03-01 DIAGNOSIS — R911 Solitary pulmonary nodule: Secondary | ICD-10-CM

## 2017-08-26 ENCOUNTER — Other Ambulatory Visit: Payer: Self-pay | Admitting: Urology

## 2017-08-26 ENCOUNTER — Encounter (HOSPITAL_BASED_OUTPATIENT_CLINIC_OR_DEPARTMENT_OTHER): Payer: Self-pay | Admitting: *Deleted

## 2017-08-26 ENCOUNTER — Other Ambulatory Visit: Payer: Self-pay

## 2017-08-26 NOTE — Progress Notes (Signed)
Spoke w/ pt via phone for pre-op interview.  Npo after mn.  Arrive at [facility].  Needs istat and ekg.  Will take am meds w/ sips of water dos.

## 2017-08-27 ENCOUNTER — Ambulatory Visit (HOSPITAL_BASED_OUTPATIENT_CLINIC_OR_DEPARTMENT_OTHER): Payer: Medicare Other | Admitting: Certified Registered Nurse Anesthetist

## 2017-08-27 ENCOUNTER — Encounter (HOSPITAL_BASED_OUTPATIENT_CLINIC_OR_DEPARTMENT_OTHER)
Admission: RE | Disposition: A | Discharge status: Home / Self Care | Payer: Self-pay | Source: Ambulatory Visit | Attending: Urology

## 2017-08-27 ENCOUNTER — Ambulatory Visit (HOSPITAL_BASED_OUTPATIENT_CLINIC_OR_DEPARTMENT_OTHER)
Admission: RE | Admit: 2017-08-27 | Discharge: 2017-08-27 | Disposition: A | Discharge status: Home / Self Care | Payer: Medicare Other | Source: Ambulatory Visit | Attending: Urology | Admitting: Urology

## 2017-08-27 ENCOUNTER — Other Ambulatory Visit: Payer: Self-pay

## 2017-08-27 ENCOUNTER — Encounter (HOSPITAL_BASED_OUTPATIENT_CLINIC_OR_DEPARTMENT_OTHER): Payer: Self-pay

## 2017-08-27 DIAGNOSIS — R310 Gross hematuria: Secondary | ICD-10-CM | POA: Insufficient documentation

## 2017-08-27 DIAGNOSIS — I10 Essential (primary) hypertension: Secondary | ICD-10-CM | POA: Diagnosis not present

## 2017-08-27 DIAGNOSIS — R7303 Prediabetes: Secondary | ICD-10-CM | POA: Insufficient documentation

## 2017-08-27 DIAGNOSIS — Z8249 Family history of ischemic heart disease and other diseases of the circulatory system: Secondary | ICD-10-CM | POA: Insufficient documentation

## 2017-08-27 DIAGNOSIS — N323 Diverticulum of bladder: Secondary | ICD-10-CM | POA: Insufficient documentation

## 2017-08-27 DIAGNOSIS — N342 Other urethritis: Secondary | ICD-10-CM | POA: Insufficient documentation

## 2017-08-27 DIAGNOSIS — Z87891 Personal history of nicotine dependence: Secondary | ICD-10-CM | POA: Insufficient documentation

## 2017-08-27 DIAGNOSIS — N400 Enlarged prostate without lower urinary tract symptoms: Secondary | ICD-10-CM | POA: Diagnosis not present

## 2017-08-27 DIAGNOSIS — Z86718 Personal history of other venous thrombosis and embolism: Secondary | ICD-10-CM | POA: Diagnosis not present

## 2017-08-27 DIAGNOSIS — N211 Calculus in urethra: Secondary | ICD-10-CM | POA: Diagnosis not present

## 2017-08-27 DIAGNOSIS — N529 Male erectile dysfunction, unspecified: Secondary | ICD-10-CM | POA: Insufficient documentation

## 2017-08-27 DIAGNOSIS — Z888 Allergy status to other drugs, medicaments and biological substances status: Secondary | ICD-10-CM | POA: Insufficient documentation

## 2017-08-27 DIAGNOSIS — Z881 Allergy status to other antibiotic agents status: Secondary | ICD-10-CM | POA: Insufficient documentation

## 2017-08-27 DIAGNOSIS — N210 Calculus in bladder: Secondary | ICD-10-CM | POA: Insufficient documentation

## 2017-08-27 DIAGNOSIS — Z8546 Personal history of malignant neoplasm of prostate: Secondary | ICD-10-CM | POA: Diagnosis not present

## 2017-08-27 DIAGNOSIS — Z86010 Personal history of colonic polyps: Secondary | ICD-10-CM | POA: Insufficient documentation

## 2017-08-27 DIAGNOSIS — Z923 Personal history of irradiation: Secondary | ICD-10-CM | POA: Diagnosis not present

## 2017-08-27 DIAGNOSIS — N3289 Other specified disorders of bladder: Secondary | ICD-10-CM | POA: Diagnosis not present

## 2017-08-27 DIAGNOSIS — E785 Hyperlipidemia, unspecified: Secondary | ICD-10-CM | POA: Diagnosis not present

## 2017-08-27 DIAGNOSIS — Z86711 Personal history of pulmonary embolism: Secondary | ICD-10-CM | POA: Insufficient documentation

## 2017-08-27 HISTORY — PX: CYSTOSCOPY WITH LITHOLAPAXY: SHX1425

## 2017-08-27 HISTORY — DX: Dysuria: R30.0

## 2017-08-27 HISTORY — DX: Presence of external hearing-aid: Z97.4

## 2017-08-27 HISTORY — DX: Solitary pulmonary nodule: R91.1

## 2017-08-27 HISTORY — PX: TRANSURETHRAL RESECTION OF PROSTATE: SHX73

## 2017-08-27 LAB — POCT I-STAT 4, (NA,K, GLUC, HGB,HCT)
Glucose, Bld: 101 mg/dL — ABNORMAL HIGH (ref 65–99)
HCT: 45 % (ref 39.0–52.0)
HEMOGLOBIN: 15.3 g/dL (ref 13.0–17.0)
POTASSIUM: 4 mmol/L (ref 3.5–5.1)
SODIUM: 141 mmol/L (ref 135–145)

## 2017-08-27 SURGERY — CYSTOSCOPY, WITH BLADDER CALCULUS LITHOLAPAXY
Anesthesia: General

## 2017-08-27 MED ORDER — CEFAZOLIN SODIUM-DEXTROSE 2-4 GM/100ML-% IV SOLN
2.0000 g | Freq: Once | INTRAVENOUS | Status: AC
  Administered 2017-08-27: 2 g via INTRAVENOUS
  Filled 2017-08-27: qty 100

## 2017-08-27 MED ORDER — LIDOCAINE 2% (20 MG/ML) 5 ML SYRINGE
INTRAMUSCULAR | Status: AC
  Filled 2017-08-27: qty 5

## 2017-08-27 MED ORDER — FENTANYL CITRATE (PF) 100 MCG/2ML IJ SOLN
INTRAMUSCULAR | Status: AC
  Filled 2017-08-27: qty 2

## 2017-08-27 MED ORDER — PHENAZOPYRIDINE HCL 200 MG PO TABS: 200.0000 mg | ORAL_TABLET | Freq: Three times a day (TID) | ORAL | 0 refills | Status: AC | PRN

## 2017-08-27 MED ORDER — OXYBUTYNIN CHLORIDE 5 MG PO TABS: 5.0000 mg | ORAL_TABLET | Freq: Three times a day (TID) | ORAL | 1 refills | Status: AC | PRN

## 2017-08-27 MED ORDER — EPHEDRINE SULFATE-NACL 50-0.9 MG/10ML-% IV SOSY
PREFILLED_SYRINGE | INTRAVENOUS | Status: DC | PRN
  Administered 2017-08-27: 10 mg via INTRAVENOUS

## 2017-08-27 MED ORDER — EPHEDRINE 5 MG/ML INJ
INTRAVENOUS | Status: AC
  Filled 2017-08-27: qty 10

## 2017-08-27 MED ORDER — TRAMADOL HCL 50 MG PO TABS: 50.0000 mg | ORAL_TABLET | Freq: Four times a day (QID) | ORAL | 0 refills | Status: AC | PRN

## 2017-08-27 MED ORDER — PROPOFOL 10 MG/ML IV BOLUS
INTRAVENOUS | Status: AC
  Filled 2017-08-27: qty 20

## 2017-08-27 MED ORDER — DEXAMETHASONE SODIUM PHOSPHATE 10 MG/ML IJ SOLN
INTRAMUSCULAR | Status: AC
  Filled 2017-08-27: qty 1

## 2017-08-27 MED ORDER — BELLADONNA ALKALOIDS-OPIUM 16.2-60 MG RE SUPP
RECTAL | Status: AC
  Filled 2017-08-27: qty 1

## 2017-08-27 MED ORDER — FENTANYL CITRATE (PF) 100 MCG/2ML IJ SOLN
INTRAMUSCULAR | Status: DC | PRN
  Administered 2017-08-27: 50 ug via INTRAVENOUS
  Administered 2017-08-27 (×2): 25 ug via INTRAVENOUS
  Administered 2017-08-27: 50 ug via INTRAVENOUS
  Administered 2017-08-27 (×2): 25 ug via INTRAVENOUS

## 2017-08-27 MED ORDER — LACTATED RINGERS IV SOLN
INTRAVENOUS | Status: DC
  Administered 2017-08-27: 11:00:00 via INTRAVENOUS
  Administered 2017-08-27: 1000 mL via INTRAVENOUS
  Filled 2017-08-27: qty 1000

## 2017-08-27 MED ORDER — ONDANSETRON HCL 4 MG PO TABS: 4.0000 mg | ORAL_TABLET | Freq: Every day | ORAL | 1 refills | Status: AC | PRN

## 2017-08-27 MED ORDER — CEFAZOLIN SODIUM-DEXTROSE 2-4 GM/100ML-% IV SOLN
INTRAVENOUS | Status: AC
  Filled 2017-08-27: qty 100

## 2017-08-27 MED ORDER — SODIUM CHLORIDE 0.9 % IR SOLN
Status: DC | PRN
  Administered 2017-08-27 (×9): 3000 mL via INTRAVESICAL

## 2017-08-27 MED ORDER — PHENYLEPHRINE 40 MCG/ML (10ML) SYRINGE FOR IV PUSH (FOR BLOOD PRESSURE SUPPORT)
PREFILLED_SYRINGE | INTRAVENOUS | Status: AC
  Filled 2017-08-27: qty 10

## 2017-08-27 MED ORDER — CEPHALEXIN 500 MG PO CAPS: 500.0000 mg | ORAL_CAPSULE | Freq: Three times a day (TID) | ORAL | 0 refills | Status: AC

## 2017-08-27 MED ORDER — LIDOCAINE HCL (CARDIAC) PF 100 MG/5ML IV SOSY
PREFILLED_SYRINGE | INTRAVENOUS | Status: DC | PRN
  Administered 2017-08-27: 50 mg via INTRAVENOUS

## 2017-08-27 MED ORDER — MIDAZOLAM HCL 5 MG/5ML IJ SOLN
INTRAMUSCULAR | Status: DC | PRN
  Administered 2017-08-27 (×2): 1 mg via INTRAVENOUS

## 2017-08-27 MED ORDER — DEXAMETHASONE SODIUM PHOSPHATE 4 MG/ML IJ SOLN
INTRAMUSCULAR | Status: DC | PRN
  Administered 2017-08-27: 5 mg via INTRAVENOUS

## 2017-08-27 MED ORDER — ONDANSETRON HCL 4 MG/2ML IJ SOLN
INTRAMUSCULAR | Status: AC
  Filled 2017-08-27: qty 2

## 2017-08-27 MED ORDER — ONDANSETRON HCL 4 MG/2ML IJ SOLN
INTRAMUSCULAR | Status: DC | PRN
  Administered 2017-08-27: 4 mg via INTRAVENOUS

## 2017-08-27 MED ORDER — FENTANYL CITRATE (PF) 100 MCG/2ML IJ SOLN
INTRAMUSCULAR | Status: AC
Start: 2017-08-27 — End: ?
  Filled 2017-08-27: qty 2

## 2017-08-27 MED ORDER — PROPOFOL 10 MG/ML IV BOLUS
INTRAVENOUS | Status: DC | PRN
  Administered 2017-08-27: 200 mg via INTRAVENOUS

## 2017-08-27 MED ORDER — KETOROLAC TROMETHAMINE 30 MG/ML IJ SOLN
INTRAMUSCULAR | Status: AC
  Filled 2017-08-27: qty 1

## 2017-08-27 MED ORDER — MIDAZOLAM HCL 2 MG/2ML IJ SOLN
INTRAMUSCULAR | Status: AC
  Filled 2017-08-27: qty 2

## 2017-08-27 MED ORDER — PHENYLEPHRINE 40 MCG/ML (10ML) SYRINGE FOR IV PUSH (FOR BLOOD PRESSURE SUPPORT)
PREFILLED_SYRINGE | INTRAVENOUS | Status: DC | PRN
  Administered 2017-08-27: 120 ug via INTRAVENOUS
  Administered 2017-08-27 (×3): 80 ug via INTRAVENOUS
  Administered 2017-08-27: 40 ug via INTRAVENOUS

## 2017-08-27 MED ORDER — FENTANYL CITRATE (PF) 100 MCG/2ML IJ SOLN
25.0000 ug | INTRAMUSCULAR | Status: DC | PRN
  Filled 2017-08-27: qty 1

## 2017-08-27 SURGICAL SUPPLY — 26 items
BAG DRAIN URO-CYSTO SKYTR STRL (DRAIN) ×3 IMPLANT
BAG DRN UROCATH (DRAIN) ×1
BAG URINE DRAINAGE (UROLOGICAL SUPPLIES) ×3 IMPLANT
BAG URO CATCHER STRL LF (MISCELLANEOUS) ×3 IMPLANT
CATH FOLEY 3WAY 30CC 24FR (CATHETERS)
CATH URET 5FR 28IN OPEN ENDED (CATHETERS) ×2 IMPLANT
CATH URTH STD 24FR FL 3W 2 (CATHETERS) IMPLANT
CLOTH BEACON ORANGE TIMEOUT ST (SAFETY) ×3 IMPLANT
FIBER LASER FLEXIVA 1000 (UROLOGICAL SUPPLIES) ×3 IMPLANT
FIBER LASER FLEXIVA 365 (UROLOGICAL SUPPLIES) ×2 IMPLANT
FIBER LASER FLEXIVA 550 (UROLOGICAL SUPPLIES) ×3 IMPLANT
GLOVE BIO SURGEON STRL SZ7.5 (GLOVE) ×3 IMPLANT
GOWN STRL REUS W/TWL XL LVL3 (GOWN DISPOSABLE) ×6 IMPLANT
GUIDEWIRE ANG ZIPWIRE 038X150 (WIRE) ×2 IMPLANT
HOLDER FOLEY CATH W/STRAP (MISCELLANEOUS) ×2 IMPLANT
LOOP CUT BIPOLAR 24F LRG (ELECTROSURGICAL) ×2 IMPLANT
MANIFOLD NEPTUNE II (INSTRUMENTS) ×3 IMPLANT
PACK CYSTO (CUSTOM PROCEDURE TRAY) ×3 IMPLANT
PIN SAFETY STERILE (MISCELLANEOUS) ×3 IMPLANT
RUBBERBAND STERILE (MISCELLANEOUS) ×2 IMPLANT
SYRINGE IRR TOOMEY STRL 70CC (SYRINGE) ×3 IMPLANT
TUBE CONNECTING 12'X1/4 (SUCTIONS) ×1
TUBE CONNECTING 12X1/4 (SUCTIONS) ×2 IMPLANT
TUBING CONNECTING 10 (TUBING) ×1 IMPLANT
TUBING CONNECTING 10' (TUBING) ×1
TUBING UROLOGY SET (TUBING) ×2 IMPLANT

## 2017-08-27 NOTE — Discharge Instructions (Signed)
HOME CARE INSTRUCTIONS  Activity: Rest for the remainder of the day.  Do not drive or operate equipment today.  You may resume normal activities in one to two days as instructed by your physician.   Meals: Drink plenty of liquids and eat light foods such as gelatin or soup this evening.  You may return to a normal meal plan tomorrow.  Return to Work: You may return to work in one to two days or as instructed by your physician.  Special Instructions / Symptoms: Call your physician if any of these symptoms occur:   -persistent or heavy bleeding  -large blood clots that are difficult to pass  -urine stream diminishes or stops completely  -fever equal to or higher than 101 degrees Farenheit.  -cloudy urine with a strong, foul odor  -severe pain    You may feel some burning pain when you urinate.  This should disappear with time.  Applying moist heat to the lower abdomen   Post Anesthesia Home Care Instructions  Activity: Get plenty of rest for the remainder of the day. A responsible individual must stay with you for 24 hours following the procedure.  For the next 24 hours, DO NOT: -Drive a car -Paediatric nurse -Drink alcoholic beverages -Take any medication unless instructed by your physician -Make any legal decisions or sign important papers.  Meals: Start with liquid foods such as gelatin or soup. Progress to regular foods as tolerated. Avoid greasy, spicy, heavy foods. If nausea and/or vomiting occur, drink only clear liquids until the nausea and/or vomiting subsides. Call your physician if vomiting continues.  Special Instructions/Symptoms: Your throat may feel dry or sore from the anesthesia or the breathing tube placed in your throat during surgery. If this causes discomfort, gargle with warm salt water. The discomfort should disappear within 24 hours.  If you had a scopolamine patch placed behind your ear for the management of post- operative nausea and/or  vomiting:  1. The medication in the patch is effective for 72 hours, after which it should be removed.  Wrap patch in a tissue and discard in the trash. Wash hands thoroughly with soap and water. 2. You may remove the patch earlier than 72 hours if you experience unpleasant side effects which may include dry mouth, dizziness or visual disturbances. 3. Avoid touching the patch. Wash your hands with soap and water after contact with the patch.

## 2017-08-27 NOTE — Anesthesia Procedure Notes (Signed)
Procedure Name: LMA Insertion Date/Time: 08/27/2017 9:46 AM Performed by: Bufford Spikes, CRNA Pre-anesthesia Checklist: Patient identified, Emergency Drugs available, Suction available and Patient being monitored Patient Re-evaluated:Patient Re-evaluated prior to induction Oxygen Delivery Method: Circle system utilized Preoxygenation: Pre-oxygenation with 100% oxygen Induction Type: IV induction Ventilation: Mask ventilation without difficulty LMA: LMA inserted LMA Size: 4.0 Number of attempts: 1 Airway Equipment and Method: Bite block Placement Confirmation: positive ETCO2 Tube secured with: Tape Dental Injury: Teeth and Oropharynx as per pre-operative assessment

## 2017-08-27 NOTE — H&P (Signed)
Urology Preoperative H&P   Chief Complaint: Gross hematuria and bladder stone  History of Present Illness: Robert Miller is a 77 y.o. male with a history of T1c, Gleason 4+3 prostate cancer initially diagnosed in 2011, s/p IMRT in 2012, urinary urgency and intermittent episodes of gross hematuria for the past several weeks.  Last PSA- 0.28 (06/2017), 0.35 (01/2017). Stable.   From a voiding standpoint, he reports 2 separate episodes of painless gross hematuria since his last visit. He reports an adequate force of stream and feels like he is emptying his bladder well. Nocturia 1-2 . He denies having to strain in order to empty. He denies flank pain, interval urinary tract infections or dysuria.   CT abdomen/pelvis wo/w from 06/01/2016 showed no evidence of urolithiasis, hydronephrosis or renal cortical masses. He was noted to have calcifications within the prostatic urethra, but no bladder stones. He was also noted to have a uniformly thickened bladder wall with several small bladder diverticula.   He had a cystoscopy in the office that revealed a large stone within the prostatic urethra that was adherent and partially obstructing his membranous urethra.  The stone is the likely source of his hematuria as no other intravesical pathology was identified on cystoscopy.  Past Medical History:  Diagnosis Date  . Bladder stone   . BPH (benign prostatic hyperplasia)   . Dysuria   . ED (erectile dysfunction)   . History of atrial fibrillation    POST OP EPISODE  --- 12-31-2009  WHICH CAUSED DVT'S AND PE'S--  AFIB RESOLVED  . History of bladder stone   . History of colon polyps   . History of DVT (deep vein thrombosis)    12-31-2009  POST OP   . History of external beam radiation therapy    2012 to 2013--  prostate 7800 cGy 40 sessions,  seminal vesicles 5600 cGy 40 sessions  . History of pulmonary embolus (PE)    BILATERAL --  POST OP 12-31-2009--  RESOLVED  . History of small bowel obstruction     PARTIAL SBO 12-31-2009 S/P  VENTRAL HERNIA REPAIR--  NO SURGICAL INVENTION  . Hyperlipidemia   . 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 Nocturia   . Prediabetes   . Prostate cancer [facility]) urologist-  dr Deziree Mokry (previously dr tannenbaum)/  oncologist-  dr Valere Dross   dx 2012,  T1 A /  RADIATION THERAPY 2012 to 2013 (7800 cGy 40 sessions,  seminal vesicles 5600 cGy 40 sessions  . Solitary pulmonary nodule    per last Chest CT 12/ 2018 , stable (per pt followed by pcp)  . Wears hearing aid in both ears    intermittantly wearing their new    Past Surgical History:  Procedure Laterality Date  . APPENDECTOMY  1953  . CATARACT EXTRACTION W/ INTRAOCULAR LENS  IMPLANT, BILATERAL  yrs ago  . COLONOSCOPY  last one 03-14-2017  . CYSTOSCOPY N/A 03/08/2014   Procedure: CYSTOSCOPY;  Surgeon: Ailene Rud, MD;  Location: [facility];  Service: Urology;  Laterality: N/A;  . CYSTOSCOPY WITH LITHOLAPAXY N/A 05/26/2012   Procedure: CYSTOSCOPY WITH LITHOLAPAXY;  Surgeon: Ailene Rud, MD;  Location: [facility];  Service: Urology;  Laterality: N/A;  . GREEN LIGHT LASER TURP (TRANSURETHRAL RESECTION OF PROSTATE N/A 05/26/2012   Procedure: GREEN LIGHT LASER TURP (TRANSURETHRAL RESECTION OF PROSTATE;  Surgeon: Ailene Rud, MD;  Location: [facility];  Service: Urology;  Laterality: N/A;  .  HOLMIUM LASER APPLICATION N/A 10/93/2355   Procedure: HOLMIUM LASER OF PROSTATIC STONES ;  Surgeon: Ailene Rud, MD;  Location: [facility];  Service: Urology;  Laterality: N/A;  . LAPAROSCOPIC REPAIR RECURRENT VENTRAL HERNIA W/ MESH  12-26-2009  . REPAIR VENTRAL HERNIA W/ MESH  03-21-2009  . TRANSTHORACIC ECHOCARDIOGRAM  01-05-2010   moderate LVH/  ef 55-65%/  mild AR/   moderate to severe reduced RVSF/  mild RAE/  moderate TR  . TRANSURETHRAL RESECTION OF PROSTATE  05/09/09  . TRANSURETHRAL RESECTION OF PROSTATE N/A  03/08/2014   Procedure: TRANSURETHRAL RESECTION OF THE PROSTATE (TURP) WITH GYRUS ;  Surgeon: Ailene Rud, MD;  Location[facility];  Service: Urology;  Laterality: N/A;    Allergies:  Allergies  Allergen Reactions  . Sudafed [Pseudoephedrine Hcl] Other (See Comments)    Urinary retention   . Afrin [Oxymetazoline]     Needed to use it too much   . Doxycycline     Stomach upset     Family History  Problem Relation Age of Onset  . Stroke Mother   . Heart attack Father     Social History:  reports that he quit smoking about 51 years ago. His smoking use included cigarettes. He has a 3.50 pack-year smoking history. He has never used smokeless tobacco. He reports that he does not drink alcohol or use drugs.  ROS: A complete review of systems was performed.  All systems are negative except for pertinent findings as noted.  Physical Exam:  Vital signs in last 24 hours: Temp:  [98.5 F (36.9 C)] 98.5 F (36.9 C) (06/18 0737) Pulse Rate:  [67] 67 (06/18 0737) Resp:  [16] 16 (06/18 0737) BP: (174)/(93) 174/93 (06/18 0737) SpO2:  [98 %] 98 % (06/18 0737) Weight:  [112 kg (247 lb)-113.4 kg (250 lb)] 113.4 kg (250 lb) (06/18 0737) Constitutional:  Alert and oriented, No acute distress Cardiovascular: Regular rate and rhythm, No JVD Respiratory: Normal respiratory effort, Lungs clear bilaterally GI: Abdomen is soft, nontender, nondistended, no abdominal masses GU: No CVA tenderness Lymphatic: No lymphadenopathy Neurologic: Grossly intact, no focal deficits Psychiatric: Normal mood and affect  Laboratory Data:  No results for input(s): WBC, HGB, HCT, PLT in the last 72 hours.  No results for input(s): NA, K, CL, GLUCOSE, BUN, CALCIUM, CREATININE in the last 72 hours.  Invalid input(s): CO3   No results found for this or any previous visit (from the past 24 hour(s)). No results found for this or any previous visit (from the past 240 hour(s)).  Renal  Function: No results for input(s): CREATININE in the last 168 hours. CrCl cannot be calculated (Patient's most recent lab result is older than the maximum 21 days allowed.).  Radiologic Imaging: No results found.  I independently reviewed the above imaging studies.  Assessment and Plan Robert Miller is a 77 y.o. male with a prostatic urethral stone causing gross hematuria  The risks, benefits and alternatives of cystolithalopaxy was discussed with the patient. Risks included, but are not limited to: bleeding, urinary tract infection, urethral stricture, retained stone fragments, the possibility that multiple surgeries may be required to treat the stone(s), MI, stroke, PE and the inherent risks of general anesthesia. The patient voices understanding and wishes to proceed.   Ellison Hughs, MD 08/27/2017, 8:00 AM  [facility] Pager: [phone]

## 2017-08-27 NOTE — Op Note (Signed)
Operative Note  Preoperative diagnosis:  1.  Prostatic urethral stone 2.  History of prostate cancer treated with IMRT in 2012 3.  Gross hematuria  Postoperative diagnosis: 1.  Prostatic urethral stone 2.  History of prostate cancer treated with IMRT in 2012 3.  Gross hematuria 4.  Papillary appearing edematous mucosa within the prostatic urethra  Procedure(s): 1.  Cystolitholapaxy 2.  Transurethral resection of the prostate 3.  Bilateral retrograde pyelograms with intraoperative interpretation of fluoroscopic imaging  Surgeon: Ellison Hughs, MD  Assistants: None  Anesthesia: General  Complications: None  EBL: 10 mL  Specimens: 1.  Prostatic urethral stone 2.  Prostatic urethral biopsies  Drains/Catheters: 1.  20 French two-way Foley catheter with 20 mL in the balloon  Intraoperative findings:   1. Large prostatic urethral stone adherent to the prostatic urethral mucosa 2. Edematous versus papillary lesions within the prostatic urethra adjacent to the adherent stone 3. Solitary right collecting system with no filling defects or dilation involving the right ureter or right renal pelvis seen on retrograde pyelogram 4. Solitary left collecting system with no filling defects or dilation involving the left ureter or left renal pelvis seen on retrograde pyelogram 5. Capacious bladder with moderate amount of trabeculation and 2 widemouthed diverticula  Indication:  Robert Miller is a 77 y.o. male with a history of prostate cancer treated via IMRT in 2012.  Recently, the patient started having intermittent episodes of painless gross hematuria and worsening urinary urgency/frequency.  He had a cystoscopy performed in the office that revealed a large prostatic urethral stone with partial occlusion of the membranous urethra.  He has been consented for the above procedures, voices understanding and wishes to proceed.  Description of procedure:  After informed consent was  obtained, the patient was brought to the operating room and general LMA anesthesia was administered. The patient was then placed in the dorsolithotomy position and prepped and draped in usual sterile fashion. A timeout was performed. A 23 French rigid cystoscope was then inserted into the urethral meatus and advanced into the prostatic urethra where his adherent stone was identified.  The scope was then advanced into the bladder and a complete bladder survey revealed the findings listed above.  A 5 French open-ended catheter was then inserted into the left ureteral orifice and a left retrograde pyelogram was obtained, the findings listed above.  A similar maneuver was then carried out on the right, with the retrograde pyelogram findings listed above.  The rigid cystoscope was then exchanged for a 26 French resectoscope with a laser sheath.  A 67 m holmium laser was then used to fracture the adherent prostatic urethral stone into multiple smaller pieces that were subsequently irrigated out of the bladder/prostatic urethra.  Once all stone was removed from the prostatic urethra, there was multiple areas of mucosal changes that appeared to be mucosal edema versus papillary tumors.  The areas of concern were then resected with a bipolar loop and sent to pathology for permanent section.  The area of resection within the prostatic urethra was then extensively fulgurated until hemostasis was achieved.  A 20 French two-way Foley catheter was then placed into the bladder with return of clear irrigant.  Hand irrigation of the catheter proved no residual prostate tissue or stones to occlude the lumen of the catheter.  Catheter was then placed to gravity drainage.  The patient tolerated the procedure well and was transferred to the postanesthesia in stable condition.  Plan: The patient has been instructed to  remove his Foley catheter at 6 AM on 08/30/2017.  He will follow-up with me in 2 weeks to discuss his pathology  results and assess his urinary symptoms.

## 2017-08-27 NOTE — Transfer of Care (Signed)
Immediate Anesthesia Transfer of Care Note  Patient: Robert Miller  Procedure(s) Performed: CYSTOSCOPY WITH LITHOLAPAXY, PROSTATE URETHRAL  BIOPSY, BILATERAL RETROGRADE (N/A ) TRANSURETHRAL RESECTION OF THE PROSTATE (TURP) (N/A )  Patient Location: PACU  Anesthesia Type:General  Level of Consciousness: awake, alert  and oriented  Airway & Oxygen Therapy: Patient Spontanous Breathing and Patient connected to nasal cannula oxygen  Post-op Assessment: Report given to RN and Post -op Vital signs reviewed and stable  Post vital signs: Reviewed and stable  Last Vitals:  Vitals Value Taken Time  BP    Temp    Pulse 79 08/27/2017 11:05 AM  Resp 11 08/27/2017 11:05 AM  SpO2 92 % 08/27/2017 11:05 AM  Vitals shown include unvalidated device data.  Last Pain:  Vitals:   08/27/17 0836  TempSrc:   PainSc: 0-No pain      Patients Stated Pain Goal: 4 (82/80/03 4917)  Complications: No apparent anesthesia complications

## 2017-08-27 NOTE — Anesthesia Preprocedure Evaluation (Signed)
Anesthesia Evaluation  Patient identified by MRN, date of birth, ID band Patient awake    Reviewed: Allergy & Precautions, NPO status , Patient's Chart, lab work & pertinent test results  Airway Mallampati: II  TM Distance: >3 FB     Dental   Pulmonary former smoker,    breath sounds clear to auscultation       Cardiovascular hypertension,  Rhythm:Regular Rate:Normal     Neuro/Psych    GI/Hepatic negative GI ROS, Neg liver ROS,   Endo/Other  negative endocrine ROS  Renal/GU negative Renal ROS     Musculoskeletal   Abdominal   Peds  Hematology   Anesthesia Other Findings   Reproductive/Obstetrics                             Anesthesia Physical Anesthesia Plan  ASA: III  Anesthesia Plan: General   Post-op Pain Management:    Induction: Intravenous  PONV Risk Score and Plan: 2 and Treatment may vary due to age or medical condition  Airway Management Planned: LMA  Additional Equipment:   Intra-op Plan:   Post-operative Plan: Extubation in OR  Informed Consent: I have reviewed the patients History and Physical, chart, labs and discussed the procedure including the risks, benefits and alternatives for the proposed anesthesia with the patient or authorized representative who has indicated his/her understanding and acceptance.   Dental advisory given  Plan Discussed with: CRNA and Anesthesiologist  Anesthesia Plan Comments:         Anesthesia Quick Evaluation

## 2017-08-27 NOTE — Anesthesia Postprocedure Evaluation (Signed)
Anesthesia Post Note  Patient: Robert Miller  Procedure(s) Performed: CYSTOSCOPY WITH LITHOLAPAXY, PROSTATE URETHRAL  BIOPSY, BILATERAL RETROGRADE (N/A ) TRANSURETHRAL RESECTION OF THE PROSTATE (TURP) (N/A )     Patient location during evaluation: PACU Anesthesia Type: General Level of consciousness: awake Pain management: pain level controlled Vital Signs Assessment: post-procedure vital signs reviewed and stable Respiratory status: spontaneous breathing Cardiovascular status: stable Anesthetic complications: no    Last Vitals:  Vitals:   08/27/17 1200 08/27/17 1245  BP:  (!) 170/97  Pulse: 62 60  Resp: 15 16  Temp:  36.7 C  SpO2: 95% 99%    Last Pain:  Vitals:   08/27/17 1245  TempSrc:   PainSc: 0-No pain                 Anthonia Monger

## 2017-08-28 ENCOUNTER — Encounter (HOSPITAL_BASED_OUTPATIENT_CLINIC_OR_DEPARTMENT_OTHER): Payer: Self-pay | Admitting: Urology

## 2018-02-12 IMAGING — CT CT CHEST W/O CM
2 of 4 series · 15 of 36 positions shown, 18 images · non-contrast
Comparison: CT 06/01/2016, 01/04/2010

CLINICAL DATA: Follow-up lung nodule, history of prostate cancer .

EXAM:
CT CHEST WITHOUT CONTRAST
TECHNIQUE: Multidetector CT imaging of the chest was performed following the
standard protocol without IV contrast.

[Series 2: chest w/(date) · axial · 0.80mm/px · z∈[+600,+912]mm · 12 of 181 slices shown, 15 images]
[im 13/181  mediastinal]
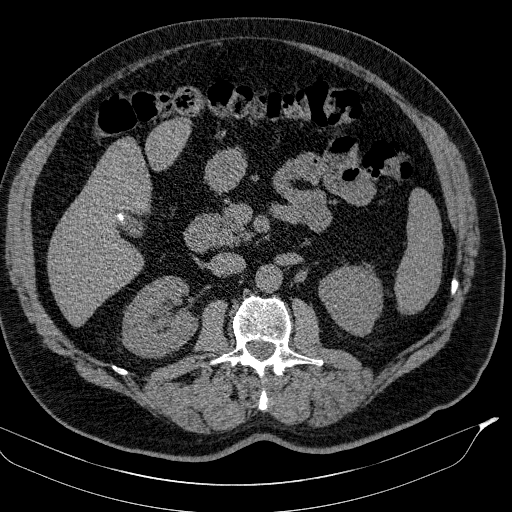
[im 13/181  lung]
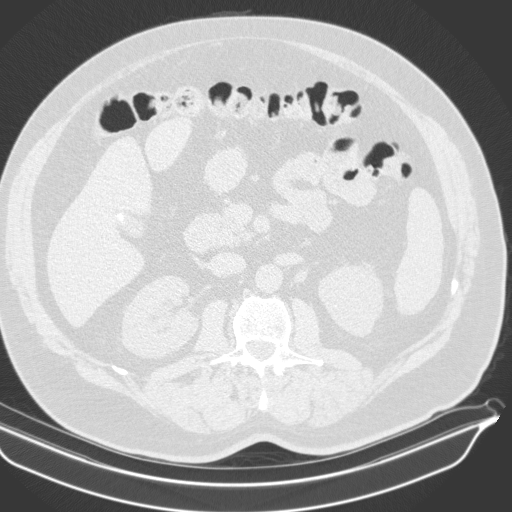
[im 25/181  lung]
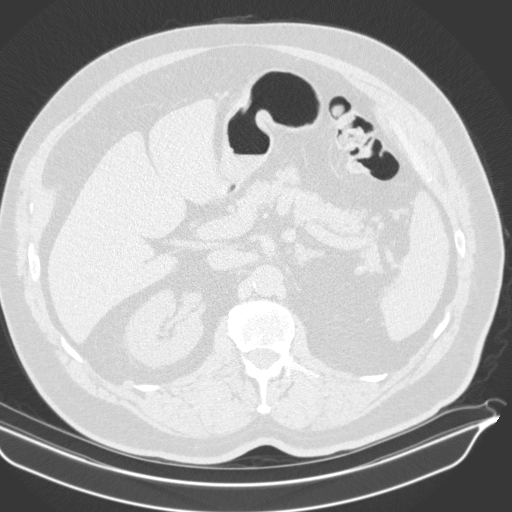
[im 37/181  lung]
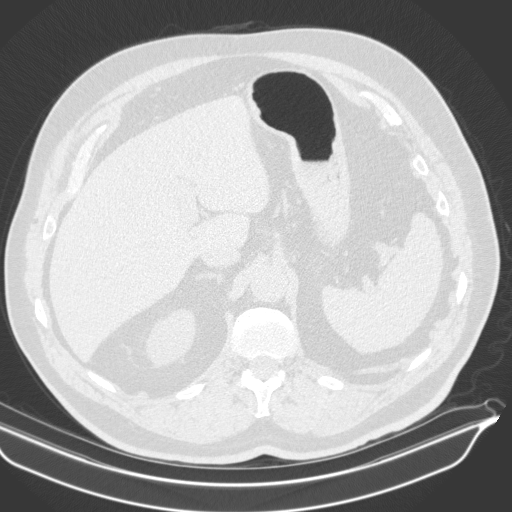
[im 61/181  lung]
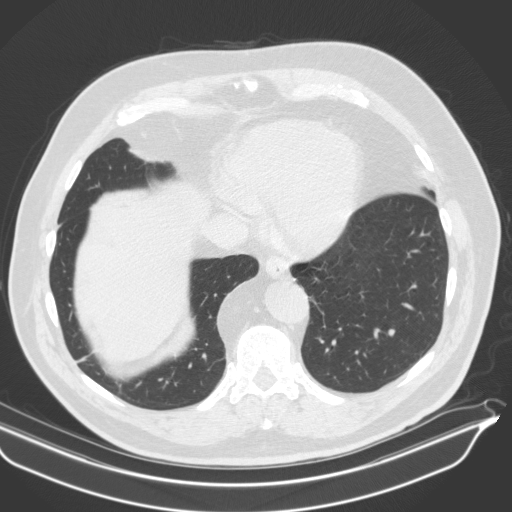
[im 73/181  mediastinal]
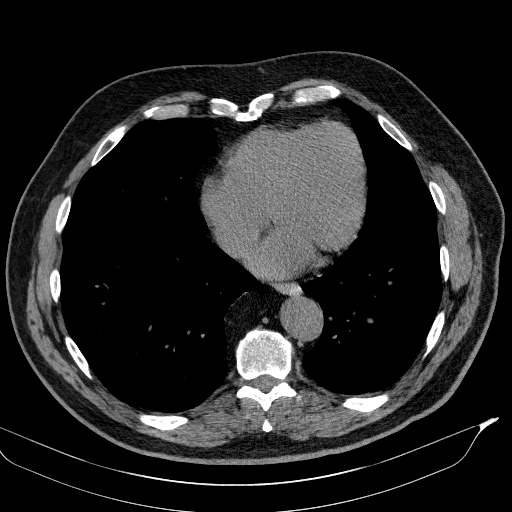
[im 73/181  lung]
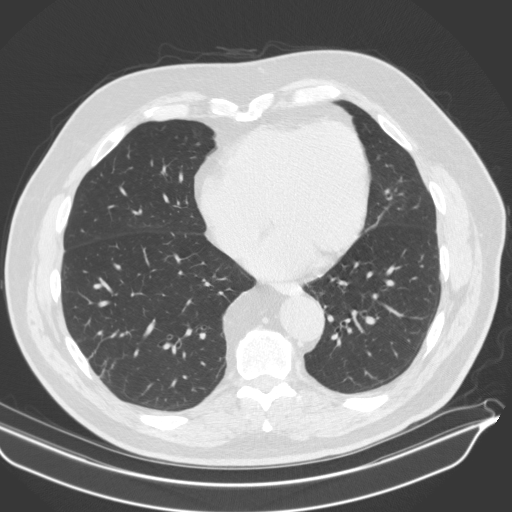
[im 85/181  lung]
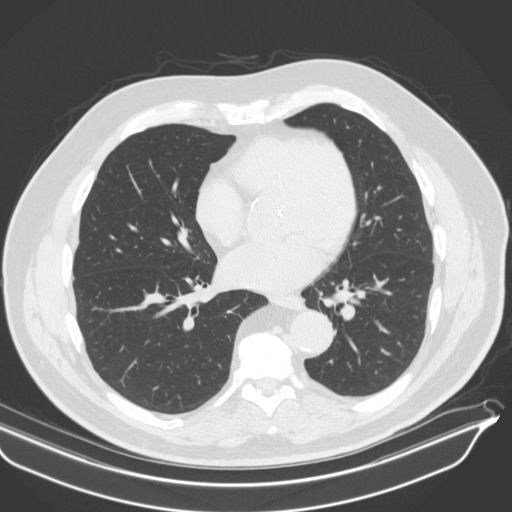
[im 97/181  lung]
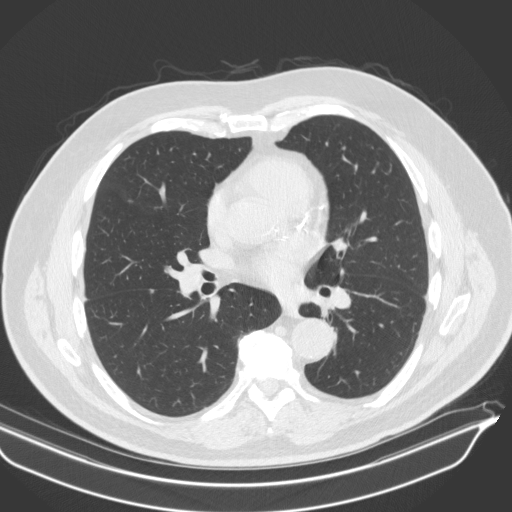
[im 109/181  lung]
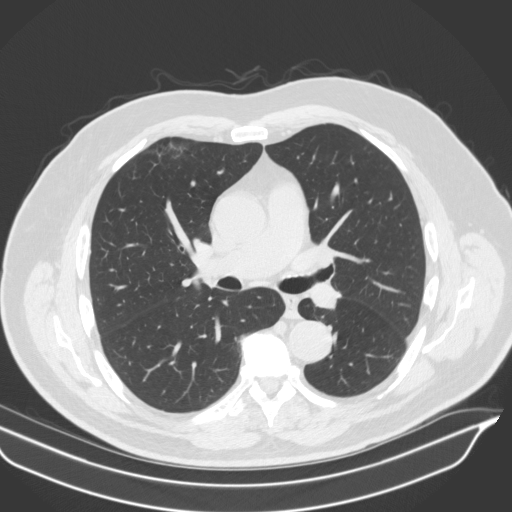
[im 121/181  mediastinal]
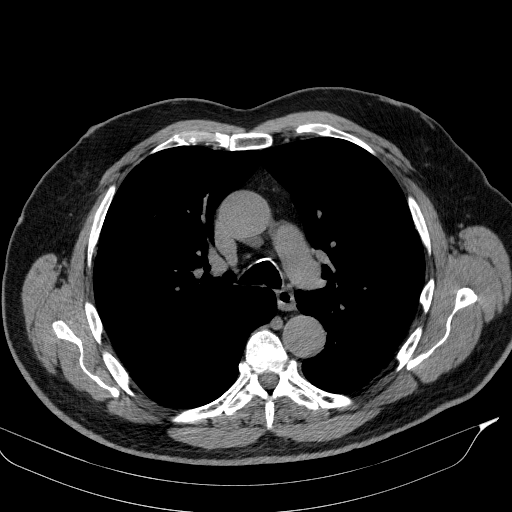
[im 121/181  lung]
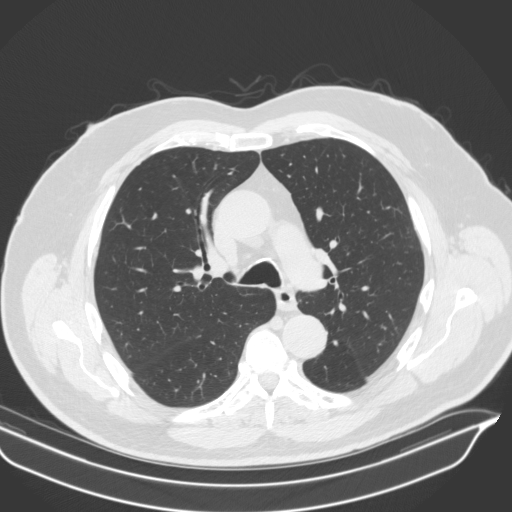
[im 145/181  lung]
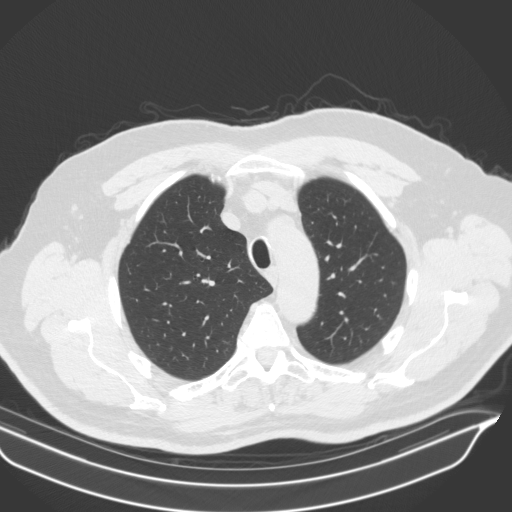
[im 157/181  lung]
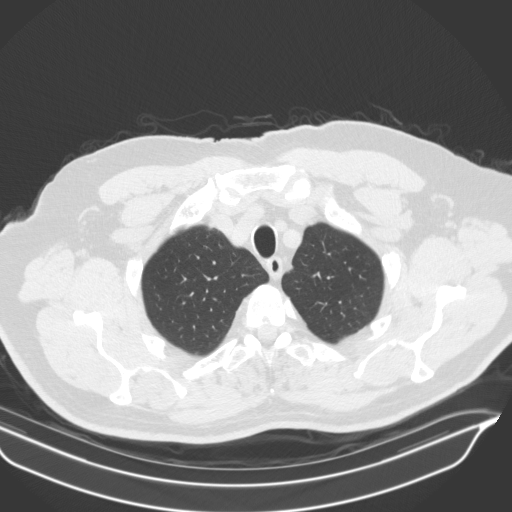
[im 169/181  lung]
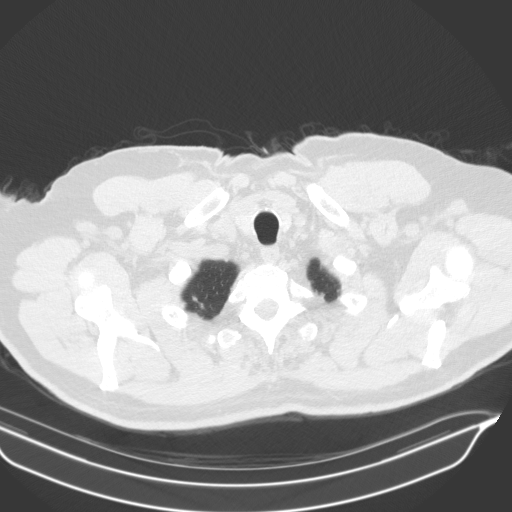

[Series 3: cor · coronal · 0.70mm/px · 3 of 160 slices shown]
[im 32/160  lung]
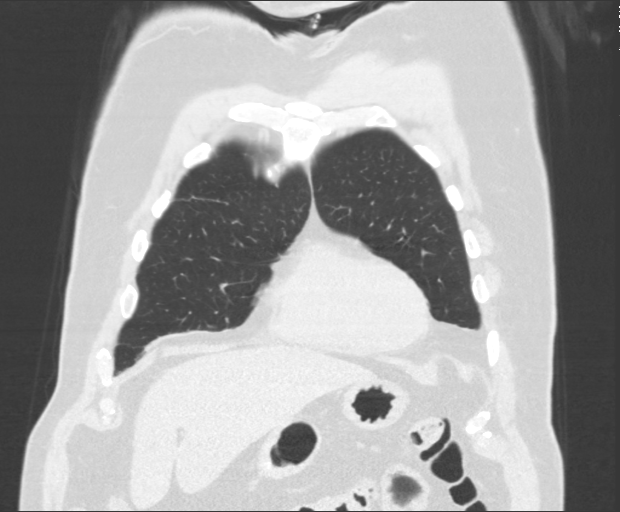
[im 64/160  lung]
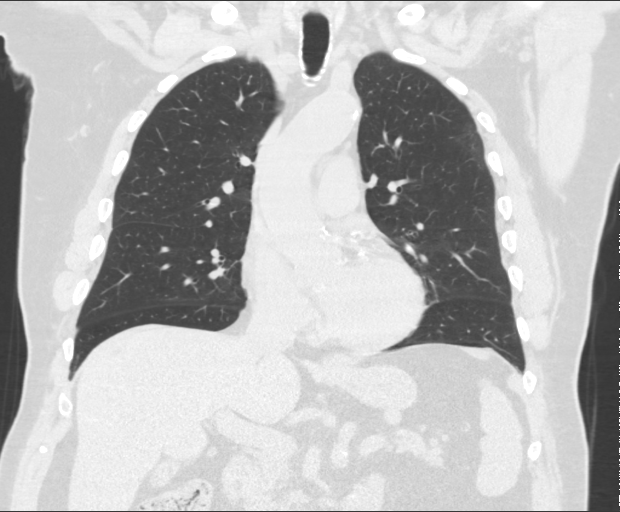
[im 96/160  lung]
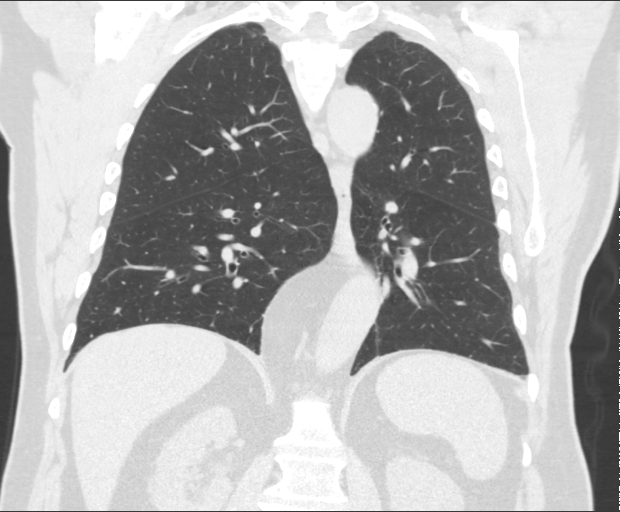

[15 of 36 positions shown; findings below may reference images not displayed]

FINDINGS: Cardiovascular: Limited evaluation without intravenous contrast.
Ectasia of the ascending aorta, measuring up to 3.9 cm.
Atherosclerotic calcification. Coronary artery calcification. Normal
heart size. No pericardial effusion.

Mediastinum/Nodes: No significantly enlarged mediastinal lymph
nodes. No thyroid mass. Midline trachea. Esophagus within normal
limits

Lungs/Pleura: Granuloma in the anterior right upper lobe unchanged.
Scattered nonspecific foci of mild subpleural density. No
consolidation or effusion. Mild scarring at the right apex.

Re- demonstrated 4 mm pulmonary nodule in the anterior right lower
lobe, series 5, image number 84. No new lung nodules. Mild scarring
in the lingula

Upper Abdomen: Small stone in the gallbladder. No acute abnormality.

Musculoskeletal: Degenerative changes. No acute or suspicious lesion
IMPRESSION: 1. Stable 4 mm pulmonary nodule in the anterior right lower lobe,
stable since 06/01/2016. Could consider 6-12 month CT follow-up
given history of prostate cancer.
2. Gallstone

Aortic Atherosclerosis ([id number]).

## 2020-03-25 DIAGNOSIS — H903 Sensorineural hearing loss, bilateral: Secondary | ICD-10-CM | POA: Diagnosis not present

## 2020-07-08 DIAGNOSIS — I129 Hypertensive chronic kidney disease with stage 1 through stage 4 chronic kidney disease, or unspecified chronic kidney disease: Secondary | ICD-10-CM | POA: Diagnosis not present

## 2020-07-08 DIAGNOSIS — E781 Pure hyperglyceridemia: Secondary | ICD-10-CM | POA: Diagnosis not present

## 2020-07-08 DIAGNOSIS — Z0000 Encounter for general adult medical examination without abnormal findings: Secondary | ICD-10-CM | POA: Diagnosis not present

## 2020-07-08 DIAGNOSIS — Z1389 Encounter for screening for other disorder: Secondary | ICD-10-CM | POA: Diagnosis not present

## 2020-07-08 DIAGNOSIS — N183 Chronic kidney disease, stage 3 unspecified: Secondary | ICD-10-CM | POA: Diagnosis not present

## 2020-08-25 DIAGNOSIS — N210 Calculus in bladder: Secondary | ICD-10-CM | POA: Diagnosis not present

## 2020-09-02 ENCOUNTER — Other Ambulatory Visit: Payer: Self-pay | Admitting: Urology

## 2020-09-09 ENCOUNTER — Other Ambulatory Visit: Payer: Self-pay

## 2020-09-09 ENCOUNTER — Encounter (HOSPITAL_BASED_OUTPATIENT_CLINIC_OR_DEPARTMENT_OTHER): Payer: Self-pay | Admitting: Urology

## 2020-09-09 NOTE — Progress Notes (Signed)
Spoke w/ via phone for pre-op interview---pt Lab needs dos----   Istat and EKG            Lab results------ COVID test -----patient states asymptomatic no test needed Arrive at -------0900, 09-14-2020 NPO after MN NO Solid Food.  Clear liquids from MN until-0800 -- Med rec completed Medications to take morning of surgery -----ditropan, Nitrofurantoin. Diabetic medication ----n/a - Patient instructed no nail polish to be worn day of surgery Patient instructed to bring photo id and insurance card day of surgery Patient aware to have Driver (ride ) / caregiver Robert Miller   for 24 hours after surgery  Patient Special Instructions -----none Pre-Op special Istructions -----none Patient verbalized understanding of instructions that were given at this phone interview. Patient denies shortness of breath, chest pain, fever, cough at this phone interview.

## 2020-09-14 ENCOUNTER — Ambulatory Visit (HOSPITAL_BASED_OUTPATIENT_CLINIC_OR_DEPARTMENT_OTHER): Payer: Medicare Other | Admitting: Anesthesiology

## 2020-09-14 ENCOUNTER — Encounter (HOSPITAL_BASED_OUTPATIENT_CLINIC_OR_DEPARTMENT_OTHER)
Admission: RE | Disposition: A | Discharge status: Home / Self Care | Payer: Self-pay | Source: Home / Self Care | Attending: Urology

## 2020-09-14 ENCOUNTER — Ambulatory Visit (HOSPITAL_BASED_OUTPATIENT_CLINIC_OR_DEPARTMENT_OTHER)
Admission: RE | Admit: 2020-09-14 | Discharge: 2020-09-14 | Disposition: A | Discharge status: Home / Self Care | Payer: Medicare Other | Attending: Urology | Admitting: Urology

## 2020-09-14 ENCOUNTER — Encounter (HOSPITAL_BASED_OUTPATIENT_CLINIC_OR_DEPARTMENT_OTHER): Payer: Self-pay | Admitting: Urology

## 2020-09-14 DIAGNOSIS — N3946 Mixed incontinence: Secondary | ICD-10-CM | POA: Diagnosis not present

## 2020-09-14 DIAGNOSIS — Z7982 Long term (current) use of aspirin: Secondary | ICD-10-CM | POA: Insufficient documentation

## 2020-09-14 DIAGNOSIS — Z8546 Personal history of malignant neoplasm of prostate: Secondary | ICD-10-CM | POA: Diagnosis not present

## 2020-09-14 DIAGNOSIS — N210 Calculus in bladder: Secondary | ICD-10-CM | POA: Insufficient documentation

## 2020-09-14 DIAGNOSIS — Z888 Allergy status to other drugs, medicaments and biological substances status: Secondary | ICD-10-CM | POA: Insufficient documentation

## 2020-09-14 DIAGNOSIS — Z923 Personal history of irradiation: Secondary | ICD-10-CM | POA: Diagnosis not present

## 2020-09-14 DIAGNOSIS — Z79899 Other long term (current) drug therapy: Secondary | ICD-10-CM | POA: Insufficient documentation

## 2020-09-14 DIAGNOSIS — I10 Essential (primary) hypertension: Secondary | ICD-10-CM | POA: Diagnosis not present

## 2020-09-14 DIAGNOSIS — Z87891 Personal history of nicotine dependence: Secondary | ICD-10-CM | POA: Insufficient documentation

## 2020-09-14 DIAGNOSIS — N211 Calculus in urethra: Secondary | ICD-10-CM | POA: Diagnosis not present

## 2020-09-14 DIAGNOSIS — E7800 Pure hypercholesterolemia, unspecified: Secondary | ICD-10-CM | POA: Diagnosis not present

## 2020-09-14 HISTORY — PX: CYSTOSCOPY WITH LITHOLAPAXY: SHX1425

## 2020-09-14 HISTORY — DX: Essential (primary) hypertension: I10

## 2020-09-14 LAB — POCT I-STAT, CHEM 8
BUN: 24 mg/dL — ABNORMAL HIGH (ref 8–23)
Calcium, Ion: 1.26 mmol/L (ref 1.15–1.40)
Chloride: 104 mmol/L (ref 98–111)
Creatinine, Ser: 1.1 mg/dL (ref 0.61–1.24)
Glucose, Bld: 104 mg/dL — ABNORMAL HIGH (ref 70–99)
HCT: 41 % (ref 39.0–52.0)
Hemoglobin: 13.9 g/dL (ref 13.0–17.0)
Potassium: 4.1 mmol/L (ref 3.5–5.1)
Sodium: 142 mmol/L (ref 135–145)
TCO2: 25 mmol/L (ref 22–32)

## 2020-09-14 SURGERY — CYSTOSCOPY, WITH BLADDER CALCULUS LITHOLAPAXY
Anesthesia: General | Site: Prostate

## 2020-09-14 MED ORDER — CEPHALEXIN 500 MG PO CAPS: 500.0000 mg | ORAL_CAPSULE | Freq: Two times a day (BID) | ORAL | 0 refills | Status: AC

## 2020-09-14 MED ORDER — LIDOCAINE 2% (20 MG/ML) 5 ML SYRINGE
INTRAMUSCULAR | Status: DC | PRN
  Administered 2020-09-14: 100 mg via INTRAVENOUS

## 2020-09-14 MED ORDER — STERILE WATER FOR IRRIGATION IR SOLN
Status: DC | PRN
  Administered 2020-09-14: 500 mL

## 2020-09-14 MED ORDER — ONDANSETRON HCL 4 MG/2ML IJ SOLN
INTRAMUSCULAR | Status: AC
  Filled 2020-09-14: qty 2

## 2020-09-14 MED ORDER — 0.9 % SODIUM CHLORIDE (POUR BTL) OPTIME
TOPICAL | Status: DC | PRN
  Administered 2020-09-14: 500 mL

## 2020-09-14 MED ORDER — BELLADONNA ALKALOIDS-OPIUM 16.2-60 MG RE SUPP
RECTAL | Status: AC
  Filled 2020-09-14: qty 1

## 2020-09-14 MED ORDER — CEFAZOLIN SODIUM-DEXTROSE 2-4 GM/100ML-% IV SOLN
INTRAVENOUS | Status: AC
  Filled 2020-09-14: qty 100

## 2020-09-14 MED ORDER — EPHEDRINE SULFATE 50 MG/ML IJ SOLN
INTRAMUSCULAR | Status: DC | PRN
  Administered 2020-09-14: 20 mg via INTRAVENOUS

## 2020-09-14 MED ORDER — STERILE WATER FOR IRRIGATION IR SOLN
Status: DC | PRN
  Administered 2020-09-14 (×2): 6000 mL

## 2020-09-14 MED ORDER — BELLADONNA ALKALOIDS-OPIUM 16.2-60 MG RE SUPP
RECTAL | Status: DC | PRN
  Administered 2020-09-14: 1 via RECTAL

## 2020-09-14 MED ORDER — FENTANYL CITRATE (PF) 100 MCG/2ML IJ SOLN: 25.0000 ug | INTRAMUSCULAR | Status: DC | PRN

## 2020-09-14 MED ORDER — LIDOCAINE HCL (PF) 2 % IJ SOLN
INTRAMUSCULAR | Status: AC
  Filled 2020-09-14: qty 5

## 2020-09-14 MED ORDER — FENTANYL CITRATE (PF) 100 MCG/2ML IJ SOLN
INTRAMUSCULAR | Status: AC
  Filled 2020-09-14: qty 2

## 2020-09-14 MED ORDER — FENTANYL CITRATE (PF) 100 MCG/2ML IJ SOLN
INTRAMUSCULAR | Status: DC | PRN
  Administered 2020-09-14 (×6): 25 ug via INTRAVENOUS

## 2020-09-14 MED ORDER — LACTATED RINGERS IV SOLN: INTRAVENOUS | Status: DC

## 2020-09-14 MED ORDER — DEXAMETHASONE SODIUM PHOSPHATE 4 MG/ML IJ SOLN
INTRAMUSCULAR | Status: DC | PRN
  Administered 2020-09-14: 5 mg via INTRAVENOUS

## 2020-09-14 MED ORDER — CEFAZOLIN SODIUM-DEXTROSE 2-4 GM/100ML-% IV SOLN
2.0000 g | Freq: Once | INTRAVENOUS | Status: AC
  Administered 2020-09-14: 2 g via INTRAVENOUS

## 2020-09-14 MED ORDER — HYDROCODONE-ACETAMINOPHEN 5-325 MG PO TABS: 1.0000 | ORAL_TABLET | ORAL | 0 refills | Status: DC | PRN

## 2020-09-14 MED ORDER — PROPOFOL 10 MG/ML IV BOLUS
INTRAVENOUS | Status: DC | PRN
  Administered 2020-09-14: 40 mg via INTRAVENOUS
  Administered 2020-09-14: 100 mg via INTRAVENOUS
  Administered 2020-09-14: 160 mg via INTRAVENOUS
  Administered 2020-09-14: 100 mg via INTRAVENOUS

## 2020-09-14 MED ORDER — ONDANSETRON HCL 4 MG/2ML IJ SOLN
INTRAMUSCULAR | Status: DC | PRN
  Administered 2020-09-14: 4 mg via INTRAVENOUS

## 2020-09-14 MED ORDER — PROPOFOL 10 MG/ML IV BOLUS
INTRAVENOUS | Status: AC
  Filled 2020-09-14: qty 20

## 2020-09-14 MED ORDER — DEXAMETHASONE SODIUM PHOSPHATE 10 MG/ML IJ SOLN
INTRAMUSCULAR | Status: AC
  Filled 2020-09-14: qty 1

## 2020-09-14 SURGICAL SUPPLY — 19 items
BAG DRN RND TRDRP ANRFLXCHMBR (UROLOGICAL SUPPLIES) ×1
BAG URINE DRAIN 2000ML AR STRL (UROLOGICAL SUPPLIES) ×1 IMPLANT
BALLN NEPHROSTOMY (BALLOONS)
BALLOON NEPHROSTOMY (BALLOONS) ×1 IMPLANT
CATH FOLEY 2WAY SLVR  5CC 18FR (CATHETERS) ×2
CATH FOLEY 2WAY SLVR 5CC 18FR (CATHETERS) IMPLANT
CLOTH BEACON ORANGE TIMEOUT ST (SAFETY) ×2 IMPLANT
FIBER LASER FLEXIVA 1000 (UROLOGICAL SUPPLIES) ×1 IMPLANT
FIBER LASER FLEXIVA 365 (UROLOGICAL SUPPLIES) ×1 IMPLANT
FIBER LASER FLEXIVA 550 (UROLOGICAL SUPPLIES) ×1 IMPLANT
GLOVE SURG ENC MOIS LTX SZ7.5 (GLOVE) ×2 IMPLANT
GOWN STRL REUS W/TWL XL LVL3 (GOWN DISPOSABLE) ×2 IMPLANT
KIT TURNOVER CYSTO (KITS) ×2 IMPLANT
MANIFOLD NEPTUNE II (INSTRUMENTS) ×2 IMPLANT
PACK CYSTO (CUSTOM PROCEDURE TRAY) ×2 IMPLANT
SYR TOOMEY IRRIG 70ML (MISCELLANEOUS) ×2
SYRINGE TOOMEY IRRIG 70ML (MISCELLANEOUS) IMPLANT
TUBE CONNECTING 12X1/4 (SUCTIONS) ×1 IMPLANT
WATER STERILE IRR 3000ML UROMA (IV SOLUTION) ×5 IMPLANT

## 2020-09-14 NOTE — Discharge Instructions (Signed)
  Post Anesthesia Home Care Instructions  Activity: Get plenty of rest for the remainder of the day. A responsible individual must stay with you for 24 hours following the procedure.  For the next 24 hours, DO NOT: -Drive a car -Paediatric nurse -Drink alcoholic beverages -Take any medication unless instructed by your physician -Make any legal decisions or sign important papers.  Meals: Start with liquid foods such as gelatin or soup. Progress to regular foods as tolerated. Avoid greasy, spicy, heavy foods. If nausea and/or vomiting occur, drink only clear liquids until the nausea and/or vomiting subsides. Call your physician if vomiting continues.  Special Instructions/Symptoms: Your throat may feel dry or sore from the anesthesia or the breathing tube placed in your throat during surgery. If this causes discomfort, gargle with warm salt water. The discomfort should disappear within 24 hours.  If you had a scopolamine patch placed behind your ear for the management of post- operative nausea and/or vomiting:  1. The medication in the patch is effective for 72 hours, after which it should be removed.  Wrap patch in a tissue and discard in the trash. Wash hands thoroughly with soap and water. 2. You may remove the patch earlier than 72 hours if you experience unpleasant side effects which may include dry mouth, dizziness or visual disturbances. 3. Avoid touching the patch. Wash your hands with soap and water after contact with the patch.    CYSTOSCOPY HOME CARE INSTRUCTIONS  Activity: Rest for the remainder of the day.  Do not drive or operate equipment today.  You may resume normal activities in one to two days as instructed by your physician.   Meals: Drink plenty of liquids and eat light foods such as gelatin or soup this evening.  You may return to a normal meal plan tomorrow.  Return to Work: You may return to work in one to two days or as instructed by your physician.  Special  Instructions / Symptoms: Call your physician if any of these symptoms occur:   -persistent or heavy bleeding  -bleeding which continues after first few urination  -large blood clots that are difficult to pass  -urine stream diminishes or stops completely  -fever equal to or higher than 101 degrees Farenheit.  -cloudy urine with a strong, foul odor  -severe pain  Females should always wipe from front to back after elimination.  You may feel some burning pain when you urinate.  This should disappear with time.  Applying moist heat to the lower abdomen or a hot tub bath may help relieve the pain.   Follow-Up / Date of Return Visit to Your Physician:  1 week for voiding trial per Dr. Lovena Neighbours

## 2020-09-14 NOTE — H&P (Signed)
PRE-OP H&P  Office Visit Report     08/25/2020   --------------------------------------------------------------------------------   Robert Miller  MRN: [id number]  DOB: 1940/06/19, 80 year old Male   PRIMARY CARE:  Robert Lass, MD  REFERRING:  Robert Norfolk. Lovena Neighbours, MD  PROVIDER:  Ellison Miller, M.D.  LOCATION:  [facility] [phone]     --------------------------------------------------------------------------------   CC/HPI: CC: Leakage   HPI: Robert Miller is a 80 year old male with a history of urinary urgency/frequency and prostate cancer initially diagnosed in 2011, s/p IMRT in 2012. He is s/p prostate biopsy in Oct. 2012, has 1/12 biopsies + for G 4+3=7 CaP in Robert R lateral base, and HgPin in Robert R lateral apex. Robert Miller is also s/p TURP in 2011 and 2015 as well as a green light vaporization of Robert prostate in 2015 with Dr. Gaynelle Arabian. 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   Last PSA- 0.32 (08/2019)0.25 (09/2018), 0.28 (06/2017), 0.35 (01/2017). Stable.   CT abdomen/pelvis wo/w from 06/01/2016 showed no evidence of urolithiasis, hydronephrosis or renal cortical masses. He was noted to have calcifications within Robert prostatic urethra, but no bladder stones. He was also noted to have a uniformly thickened bladder wall with several small bladder diverticula.   08/23/17: Robert Miller presents back to clinic today to have a cystoscopy to investigate his painless gross hematuria. He reports at least 3 episodes of gross hematuria since his last OV. He denies flank pain, dysuria or N/V/F/C.   09/13/17: Robert Miller is s/p cystolithalopaxy and bladder biopsy on 08/27/17. Post-op, Robert Miller had leakage around his catheter that required replacement and additional sterile water in Robert balloon. From a  urinary standpoint, he reports a good FOS and feels like he is emptying his bladder well. He has occasional urgency/frequency during Robert day and is wearing 2-3 ppd, which has worsened since surgery. Nocturia x 1. Denies interval UTIs, dysuria or hematuria.   Bladder biopsy pathology: benign   Married Robert Miller on July 4th, 2019   11/18/17: Robert Miller presents back to Robert office today reporting intermittent episodes of painless gross hematuria for Robert past 2-3 weeks. He also reports ongoing urinary urgency/frequency with occasional episodes of urge incontinence since his cystolitholapaxy in June. UA today is worrisome for acute cystitis/UTI.   12/02/17: Daaiel is here today for a cystoscopy to assess his on-going LUTS since his cystolithalopaxy back in June of this year. He reports persistent day and nighttime urgency/frequency despite oxybutynin and a one week course of cipro. He did admit that his sxs briefly abated in Robert middle of Robert course of cipro, but soon returned once he stopped Robert abx. He denies N/V/F/C, dysuria or hematuria. UA today is concerning for persistent cystitis.   12/09/17: Robert Miller presents back to Robert office today for follow-up of his urinary symptoms. His urine culture from 12/02/2017 grew staph epidermidis. He was started on an empiric course of Cipro at that time. Repeat cystoscopy on 9/23 demonstrated regrowth of Robert prostatic urethral calcifications that has been seen over Robert past several years. Today, he reports some improvement in his urinary urgency and frequency, but still has occasional episodes of incontinence, specifically with Valsalva/lifting. Urinalysis today still shows signs of a urinary tract infection, despite 2 separate rounds of antibiotics.   01/23/18: Robert Miller is here today for routine checkup. He was started  on a 6 week course of Cipro as well as potassium citrate at his last visit. He reports persistent leakage, especially with movement and lifting heavy objects.  Minimal urgency. When he does void on his own, he reports a good FOS and feels like he is emptying adequately. Wearing 3-4 ppd, but states that he will changes his pads even with a small amount of leakage. He has stopped tamsulosin and noted some improvement in his incontinence.   09/25/18: Robert Miller is here today for a routine follow-up of his SUI. Leakage is highly dependent on his level of activity. Number of pads--sedentary= 1-2, active= 2-3. When he urinates on his own, he feels like he has an adequate force of stream and is emptying his bladder well. He denies interval episodes of dysuria or hematuria. He does note that he is passed at least a few small stones since his last visit. He denies flank pain, nausea/vomiting or fever/chills. Stopped k-cit due to diarrhea.   08/24/19: Robert Miller is here today for a follow-up after passing multiple small stones associated with gross hematuria. He passed several stones over Robert past 6 weeks and notes improvement in his FOS--he is currently on tamsulosin that he received from his PCP. He feels like he is emptying his bladder well. He continues to have urge incontinence that is exacerbated with physical activity--wearing 2-3 ppd. Still taking oxybutynin 10 mg once daily w/o any side effects. He tried Myrbetriq recently w/o any improvement in his sxs. He was treated with bactrim for a suspected UTI last month-- urine culture was ultimately negative. He denies dysuria or hematuria today.   02/25/20: Robert Miller is here today for a routine follow-up. Robert Miller reports that he has not been taking tamsulosin for Robert past several weeks. He made a decision not to take nitrofurantoin for UTI prophylaxis either. He notes worsening leakage, primarily with physical activity, over Robert past 3-4 weeks. He denies recent stone passage or hematuria. He does note some dysuria at Robert end of his void, which has been present for Robert past 1-2 weeks. He denies suprapubic discomfort or this  incision of incomplete bladder emptying. Hypertension noted today on exam--he admits that he sporadically takes his blood pressure medications.   08/25/2020: Robert Miller continues to report urinary urgency along with stress incontinence with physical activity and incontinence while sleeping. He denies interval UTIs, dysuria or gross hematuria. He also denies interval stone passage. Remains very frustrated with his urinary symptoms.     ALLERGIES: Sudafed 12 Hour TB12 - Other Reaction, retention    MEDICATIONS: Macrobid 100 mg capsule 1 capsule PO Daily  Aspirin Ec 81 mg tablet, delayed release  Atorvastatin Calcium  Cinnamon CAPS Oral  CoQ10 CAPS Oral  Losartan Potassium 100 mg tablet  Multiple Vitamin TABS Oral  Nitrofurantoin 100 mg capsule capsule PRN  Oxybutynin Chloride Er 10 mg tablet, extended release 24 hr 1 tablet PO Daily  Vitamin D3 TABS Oral     GU PSH: Cysto Bladder Stone <2.5cm - 2016, 2014 Cystoscopy - 12/04/2018, 2019, 2019 Cystoscopy TURP - 2019, 2016, 2011 Laser Surgery Prostate - 2014 Locm 300-399Mg /Ml Iodine,1Ml - 2018     NON-GU PSH: Appendectomy - 2011 Cataract Surgery.. Incisional hernia repair (open) - 2012, 2011 Laparoscopy, Surgical; Repair Incisional Or Ventral Hernia     GU PMH: History of prostate cancer - 02/25/2020, - 09/25/2018 Mixed incontinence - 02/25/2020, - 09/25/2018 Urge incontinence, Robert Miller has complex voiding dysfunction. When I suspect is that his heavily  encrusted prostatic urethra was allowing him to stay at least partially continent and then when Robert stones were removed from his recent cystoscopy, did open his bladder neck to Robert point where it was unable to collapse and keep him continent. - 2019 Acute Cystitis/UTI - 2019 Urinary Urgency (Worsening) - 2019, (Worsening), - 2019 Urinary Frequency (Worsening) - 2019, - 2018 (Worsening, Chronic), Will begin Rapaflo 8 mg 1 po daily. He will CIC PRN as he has done in Robert past if he  feels he needs. Recommend at least CIC at HS. F/U as scheduled for both CT and cysto, - 2018, Urinary frequency, - 2016 Bladder Stone - 2019, - 2018, - 2018, Bladder calculus, - 2016 BPH w/LUTS - 2018, - 2018 Prostate Cancer - 2018, - 2018, Prostate cancer, - 2017 Gross hematuria (Stable) - 2018, Gross hematuria, - 2014 Nocturia - 2018 Urinary Tract Inf, Unspec site, Pyuria - 2017 Stress Incontinence, Male stress incontinence - 2016 Prostate Stones, Calculus, prostate - 2016 Dysuria, Dysuria - 2015 Hemorrhagic cystitis (w/o hematuria), Cystitis - 2015 Weak Urinary Stream, Weak urinary stream - 2015 Urinary Retention, Unspec, Urinary retention - 2014      PMH Notes:  2010-11-28 14:42:05 - Note: Pulmonary Embolism   s/p TURP and holmium laser prostatic calcification 03/08/14. Pathology: small focus of adenocarcinoma of prostate Gleason 3+3=6. He is having some occasional stress & urge incontinence, which is resolving.    Hx of bladder stones & passed stones in Sept 2015. He was last seen by Jimmey Ralph, NP on 10/21/13 for an overdue 6 month office visit. In April 2015, had weak stream with frequency but a few days later was able to pass some stone fragments and voiding issues resolved. He is continuing to pass stones intermittently. He is able to urinate without too much difficulty.    Hx of intermittent gross hematuria up until Sept 2014. In Oct, he started noticing that he would have a weak stream & then he would pass something (? stone).    S/P Green light procedure & cystolithopaxy on 05/26/12. He states that he has frequency, mixed incontinence, but a good stream. He has passed some prostatic stones and some necrotic tissue.    Hx intermittent gross hematuria and blood in stools X 2 months. Cystoscopy on 04/23/12 showed a bladder stone & bladder trabeculation. He saw Dr. Penelope Coop in reference to rectal bleeding & had a colonoscopy on 04/02/12. Does have h/o tobacco use but none in >47 yrs.  H/O chemical exposure (worked in [employer]) and IMRT X 40 sessions 2012-13.    Hx of prostate cancer. S/P TUR-P for urinary retention on 05/09/09, with path showing G 3+3=6 CaP, <2% of Robert tissue.     NON-GU PMH: Other nonspecific abnormal finding of lung field - 2018 Encounter for general adult medical examination without abnormal findings, Encounter for preventive health examination - 2017 Muscle weakness (generalized), Muscle weakness - 2016 Other lack of coordination, Muscular incoordination - 2016 Melena, Hematochezia - 2014 Personal history of other endocrine, nutritional and metabolic disease, History of hypercholesterolemia - 2014    FAMILY HISTORY: Acute Myocardial Infarction - Father Father Deceased At Age29 ___ - [place] In Family Mother Deceased At Age 29 from diabetic complicati - Runs In Family Stroke Syndrome - Sister, Mother   SOCIAL HISTORY: Marital Status: Married Preferred Language: English; Ethnicity: Not Hispanic Or Latino; Race: White Current Smoking Status: Miller does not smoke anymore. Has not smoked since 04/12/1965. Smoked for 5 years. Smoked 1 pack  per day.   Tobacco Use Assessment Completed: Used Tobacco in last 30 days? Has never drank.  Drinks 1 caffeinated drink per day. Miller's occupation is/was Retired.     Notes: 2 sons, 2 daughters   REVIEW OF SYSTEMS:    GU Review Male:   Miller denies frequent urination, hard to postpone urination, burning/ pain with urination, get up at night to urinate, leakage of urine, stream starts and stops, trouble starting your stream, have to strain to urinate , erection problems, and penile pain.  Gastrointestinal (Upper):   Miller denies nausea, vomiting, and indigestion/ heartburn.  Gastrointestinal (Lower):   Miller denies diarrhea and constipation.  Constitutional:   Miller denies fever, night sweats, weight loss, and fatigue.  Skin:   Miller denies skin rash/ lesion and itching.  Eyes:   Miller denies  blurred vision and double vision.  Ears/ Nose/ Throat:   Miller denies sore throat and sinus problems.  Hematologic/Lymphatic:   Miller denies easy bruising and swollen glands.  Cardiovascular:   Miller denies leg swelling and chest pains.  Respiratory:   Miller denies cough and shortness of breath.  Endocrine:   Miller denies excessive thirst.  Musculoskeletal:   Miller denies back pain and joint pain.  Neurological:   Miller denies headaches and dizziness.  Psychologic:   Miller denies depression and anxiety.   VITAL SIGNS:      08/25/2020 09:47 AM  Weight 233 lb / 105.69 kg  Height 71 in / 180.34 cm  BP 161/106 mmHg  Pulse 116 /min  Temperature 98.0 F / 36.6 C  BMI 32.5 kg/m   MULTI-SYSTEM PHYSICAL EXAMINATION:    Constitutional: Well-nourished. No physical deformities. Normally developed. Good grooming.  Neurologic / Psychiatric: Oriented to time, oriented to place, oriented to person. No depression, no anxiety, no agitation.  Musculoskeletal: Normal gait and station of head and neck.     Complexity of Data:  Lab Test Review:   PSA  Records Review:   Previous Miller Records   08/18/20 08/24/19 09/16/18 07/09/17 01/18/17 05/01/16 03/17/15 09/23/14  PSA  Total PSA 0.23 ng/mL 0.32 ng/mL 0.25 ng/mL 0.28 ng/mL 0.35 ng/mL 0.42 ng/dl 0.50  0.46     PROCEDURES:         Flexible Cystoscopy - 52000  Risks, benefits, and some of Robert potential complications of Robert procedure were discussed at length with Robert Miller including infection, bleeding, voiding discomfort, urinary retention, fever, chills, sepsis, and others. All questions were answered. Informed consent was obtained. Antibiotic prophylaxis was given. Sterile technique and intraurethral analgesia were used.  Meatus:  Normal size. Normal location. Normal condition.  Urethra:  No strictures.  External Sphincter:  Normal.  Verumontanum:  Normal.  Prostate:  Multiple prostatic urethral stones are nearly occluding Robert  lumen of Robert prostate.      Robert lower urinary tract was carefully examined. Robert procedure was well-tolerated and without complications. Antibiotic instructions were given. Instructions were given to call Robert office immediately for bloody urine, difficulty urinating, urinary retention, painful or frequent urination, fever, chills, nausea, vomiting or other illness. Robert Miller stated that he understood these instructions and would comply with them.         Urinalysis w/Scope Dipstick Dipstick Cont'd Micro  Color: Yellow Bilirubin: Neg mg/dL WBC/hpf: 6 - 10/hpf  Appearance: Clear Ketones: Neg mg/dL RBC/hpf: 3 - 10/hpf  Specific Gravity: 1.030 Blood: 1+ ery/uL Bacteria: Few (10-25/hpf)  pH: <=5.0 Protein: 1+ mg/dL Cystals: NS (Not Seen)  Glucose: Neg mg/dL  Urobilinogen: 0.2 mg/dL Casts: NS (Not Seen)    Nitrites: Neg Trichomonas: Not Present    Leukocyte Esterase: 1+ leu/uL Mucous: Not Present      Epithelial Cells: 0 - 5/hpf      Yeast: NS (Not Seen)      Sperm: Not Present    ASSESSMENT:      ICD-10 Details  1 GU:   History of prostate cancer - Z85.46 Chronic, Stable  2   Mixed incontinence - N39.46 Chronic, Stable  3   Bladder Stone - N21.0 Chronic, Worsening   PLAN:           Schedule Return Visit/Planned Activity: Next Available Appointment - Schedule Surgery          Document Letter(s):  Created for Miller: Clinical Summary         Notes:   -Multiple prostatic urethral stones seen during office cystoscopy today and are likely Robert source of his ongoing LUTS. Plan for cystolitholapaxy. Risk, benefits and alternatives discussed.  -Continue tamsulosin and oxybutynin.

## 2020-09-14 NOTE — Anesthesia Preprocedure Evaluation (Addendum)
Anesthesia Evaluation  Patient identified by MRN, date of birth, ID band Patient awake    Reviewed: Allergy & Precautions, NPO status   Airway Mallampati: II  TM Distance: >3 FB     Dental   Pulmonary former smoker,    breath sounds clear to auscultation       Cardiovascular hypertension,  Rhythm:Regular Rate:Normal     Neuro/Psych    GI/Hepatic negative GI ROS, Neg liver ROS,   Endo/Other  negative endocrine ROS  Renal/GU negative Renal ROS     Musculoskeletal   Abdominal   Peds  Hematology   Anesthesia Other Findings   Reproductive/Obstetrics                            Anesthesia Physical Anesthesia Plan  ASA: 3  Anesthesia Plan: General   Post-op Pain Management:    Induction: Intravenous  PONV Risk Score and Plan: 2 and Ondansetron, Dexamethasone and Midazolam  Airway Management Planned: LMA  Additional Equipment:   Intra-op Plan:   Post-operative Plan: Extubation in OR  Informed Consent: I have reviewed the patients History and Physical, chart, labs and discussed the procedure including the risks, benefits and alternatives for the proposed anesthesia with the patient or authorized representative who has indicated his/her understanding and acceptance.     Dental advisory given  Plan Discussed with: Anesthesiologist and CRNA  Anesthesia Plan Comments:         Anesthesia Quick Evaluation

## 2020-09-14 NOTE — Op Note (Signed)
Operative Note  Preoperative diagnosis:  1.  Recurrent prostatic urethral and bladder stones 2.  History of prostate cancer treated by IMRT in 2012 3.  Mixed incontinence  Postoperative diagnosis: 1.  Recurrent prostatic urethral and bladder stones 2.  History of prostate cancer treated by IMRT in 2012 3.  Mixed incontinence  Procedure(s): 1.  Cystolitholapaxy of prostatic urethral and bladder stones measuring 2 cm in greatest dimension  Surgeon: Ellison Hughs, MD  Assistants:  None  Anesthesia:  General  Complications:  None  EBL: 10 mL  Specimens: 1.  Prostatic urethral and bladder stones  Drains/Catheters: 1.  18 French Foley catheter  Intraoperative findings:   The mucosa surrounding the external urethral sphincter appeared atrophied Verumontanum surgically absent Extensive calcification circumferentially involving and densely adherent to the prostatic urethra with the largest calcification measuring approximately 2 cm Multiple 2 cm bladder stones  Indication:  Robert Miller is a 80 y.o. male with a history of grade 3 prostate cancer, status post IMRT in 2012 along with multiple TURPs and greenlight prostate ablation in 2011 and 2015.  He has since struggled with recurrent prostatic urethral stones along with mixed incontinence.  He underwent a cystoscopy in the office and was found to have extensive calcifications involving the prostatic urethra.  He has been consented for the above procedures, voices understanding and wishes to proceed.  Description of procedure:  After informed consent was obtained, the patient was brought to the operating room and general LMA anesthesia was administered. The patient was then placed in the dorsolithotomy position and prepped and draped in the usual sterile fashion. A timeout was performed. A 23 French rigid cystoscope was then inserted into the urethral meatus and advanced into the membranous urethra where the mucosa surrounding  the external sphincter appeared atrophied the verumontanum was surgically absent and the prostatic urethra was found to be extensively involved with circumferential calcifications that were densely adherent to the prostatic urethral mucosa.  The 23 French rigid cystoscope was then exchanged for a 26 French continuous-flow sheath with a laser bridge.  A 69 m holmium laser was then used to fracture the stones that were adherent to the prostatic urethra.  The sheath of the scope was also used to gently scrape the stones off of the prostatic urethral mucosa.  Once the stone burden was cleared from the prostatic urethra, the scope was advanced into the bladder where numerous 2 cm bladder stones were identified.  The holmium laser was then used to fracture all calcifications within the bladder lumen.  The stone fragments were then evacuated from the lumen of the bladder via [place] irrigation through the continuous-flow sheath.  The cystoscope was then removed.  Due to the extent of manipulation to remove the prostatic urethral stones, I made a decision to place an 4 French Foley catheter to assist with hemostasis.  The Foley catheter was hand irrigated with return of clear to light pink irrigant with no clots.  The Foley catheter was placed to gravity drainage.  The patient was awoken from anesthesia having tolerated the procedure well.  He was transferred to the postanesthesia in stable condition.  Plan: Keep Foley catheter in place for 1 week.  I will arrange follow-up in the office for a voiding trial.

## 2020-09-14 NOTE — Transfer of Care (Signed)
Immediate Anesthesia Transfer of Care Note  Patient: Robert Miller  Procedure(s) Performed: CYSTOSCOPY WITH LITHOLAPAXY (Prostate)  Patient Location: PACU  Anesthesia Type:General  Level of Consciousness: drowsy  Airway & Oxygen Therapy: Patient Spontanous Breathing and Patient connected to nasal cannula oxygen  Post-op Assessment: Report given to RN  Post vital signs: stable   Last Vitals:  Vitals Value Taken Time  BP 195/94 09/14/20 1054  Temp    Pulse 60 09/14/20 1055  Resp 14 09/14/20 1055  SpO2 99 % 09/14/20 1055  Vitals shown include unvalidated device data.  Last Pain:  Vitals:   09/14/20 0914  TempSrc: Oral  PainSc: 0-No pain      Patients Stated Pain Goal: 5 (96/75/91 6384)  Complications: No notable events documented.

## 2020-09-14 NOTE — Anesthesia Procedure Notes (Signed)
Procedure Name: LMA Insertion Date/Time: 09/14/2020 9:55 AM Performed by: Justice Rocher, CRNA Pre-anesthesia Checklist: Timeout performed Patient Re-evaluated:Patient Re-evaluated prior to induction Oxygen Delivery Method: Circle system utilized Preoxygenation: Pre-oxygenation with 100% oxygen Induction Type: IV induction Ventilation: Mask ventilation without difficulty LMA: LMA inserted LMA Size: 5.0 Number of attempts: 1 Airway Equipment and Method: Bite block Placement Confirmation: positive ETCO2, breath sounds checked- equal and bilateral and CO2 detector Tube secured with: Tape Dental Injury: Teeth and Oropharynx as per pre-operative assessment

## 2020-09-14 NOTE — Anesthesia Postprocedure Evaluation (Signed)
Anesthesia Post Note  Patient: Robert Miller  Procedure(s) Performed: CYSTOSCOPY WITH LITHOLAPAXY (Prostate)     Patient location during evaluation: PACU Anesthesia Type: General Level of consciousness: awake Vital Signs Assessment: post-procedure vital signs reviewed and stable Respiratory status: spontaneous breathing Cardiovascular status: stable Postop Assessment: no apparent nausea or vomiting Anesthetic complications: no   No notable events documented.  Last Vitals:  Vitals:   09/14/20 1130 09/14/20 1225  BP: (!) 173/87 (!) 166/91  Pulse: 62 60  Resp: 14 16  Temp:  36.5 C  SpO2: 100% 98%    Last Pain:  Vitals:   09/14/20 1225  TempSrc:   PainSc: 0-No pain                 Cole Klugh

## 2020-09-15 ENCOUNTER — Encounter (HOSPITAL_BASED_OUTPATIENT_CLINIC_OR_DEPARTMENT_OTHER): Payer: Self-pay | Admitting: Urology

## 2020-09-21 DIAGNOSIS — N210 Calculus in bladder: Secondary | ICD-10-CM | POA: Diagnosis not present

## 2020-09-22 DIAGNOSIS — L570 Actinic keratosis: Secondary | ICD-10-CM | POA: Diagnosis not present

## 2020-09-22 DIAGNOSIS — L821 Other seborrheic keratosis: Secondary | ICD-10-CM | POA: Diagnosis not present

## 2020-09-22 DIAGNOSIS — D0462 Carcinoma in situ of skin of left upper limb, including shoulder: Secondary | ICD-10-CM | POA: Diagnosis not present

## 2020-09-22 DIAGNOSIS — C44612 Basal cell carcinoma of skin of right upper limb, including shoulder: Secondary | ICD-10-CM | POA: Diagnosis not present

## 2020-09-22 DIAGNOSIS — C44519 Basal cell carcinoma of skin of other part of trunk: Secondary | ICD-10-CM | POA: Diagnosis not present

## 2020-11-01 DIAGNOSIS — Z961 Presence of intraocular lens: Secondary | ICD-10-CM | POA: Diagnosis not present

## 2020-12-15 DIAGNOSIS — N210 Calculus in bladder: Secondary | ICD-10-CM | POA: Diagnosis not present

## 2020-12-22 DIAGNOSIS — N210 Calculus in bladder: Secondary | ICD-10-CM | POA: Diagnosis not present

## 2020-12-26 DIAGNOSIS — L570 Actinic keratosis: Secondary | ICD-10-CM | POA: Diagnosis not present

## 2020-12-26 DIAGNOSIS — D239 Other benign neoplasm of skin, unspecified: Secondary | ICD-10-CM | POA: Diagnosis not present

## 2020-12-26 DIAGNOSIS — L821 Other seborrheic keratosis: Secondary | ICD-10-CM | POA: Diagnosis not present

## 2020-12-26 DIAGNOSIS — D485 Neoplasm of uncertain behavior of skin: Secondary | ICD-10-CM | POA: Diagnosis not present

## 2020-12-26 DIAGNOSIS — Z85828 Personal history of other malignant neoplasm of skin: Secondary | ICD-10-CM | POA: Diagnosis not present

## 2020-12-30 DIAGNOSIS — N210 Calculus in bladder: Secondary | ICD-10-CM | POA: Diagnosis not present

## 2021-04-06 DIAGNOSIS — R350 Frequency of micturition: Secondary | ICD-10-CM | POA: Diagnosis not present

## 2021-04-13 ENCOUNTER — Other Ambulatory Visit: Payer: Self-pay | Admitting: Urology

## 2021-04-14 NOTE — Patient Instructions (Addendum)
DUE TO COVID-19 ONLY ONE VISITOR IS ALLOWED TO COME WITH YOU AND STAY IN THE WAITING ROOM ONLY DURING PRE OP AND PROCEDURE DAY OF SURGERY IF YOU ARE GOING HOME AFTER SURGERY. IF YOU ARE SPENDING THE NIGHT 2 PEOPLE MAY VISIT WITH YOU IN YOUR PRIVATE ROOM AFTER SURGERY UNTIL VISITING  HOURS ARE OVER AT 800 PM AND 1  VISITOR  MAY  SPEND THE NIGHT.   YOU NEED TO HAVE A COVID 19 TEST ON_2/24/23______ @__9 :45_____, THIS TEST MUST BE DONE BEFORE SURGERY,                 Robert Miller    Your procedure is scheduled on: 05/09/21   Report to [facility] Main  Entrance   Report to short stay at 5:15 AM     Call this number if you have problems the morning of surgery [phone]    Remember: Do not eat food or drink :After Midnight the night before your surgery,        BRUSH YOUR TEETH MORNING OF SURGERY AND RINSE YOUR MOUTH OUT, NO CHEWING GUM CANDY OR MINTS.     Take these medicines the morning of surgery with A SIP OF WATER: Tamsulosin  Stop taking __ASA_________on ___2/20_______as instructed by ___Dr. MacDiarmid__________.                                   You may not have any metal on your body including              piercings  Do not wear jewelry, lotions, powders or deodorant             Men may shave face and neck.   Do not bring valuables to the hospital. [place].  Contacts, dentures or bridgework may not be worn into surgery.  Leave suitcase in the car. After surgery it may be brought to your room.               [facility] - Preparing for Surgery Before surgery, you can play an important role.  Because skin is not sterile, your skin needs to be as free of germs as possible.  You can reduce the number of germs on your skin by washing with CHG (chlorahexidine gluconate) soap before surgery.  CHG is an antiseptic cleaner which kills germs and bonds with the skin to continue killing germs even after washing. Please DO  NOT use if you have an allergy to CHG or antibacterial soaps.  If your skin becomes reddened/irritated stop using the CHG and inform your nurse when you arrive at Short Stay. You may shave your face/neck. Please follow these instructions carefully:  1.  Shower with CHG Soap the night before surgery and the  morning of Surgery.  2.  If you choose to wash your hair, wash your hair first as usual with your  normal  shampoo.  3.  After you shampoo, rinse your hair and body thoroughly to remove the  shampoo.                            4.  Use CHG as you would any other liquid soap.  You can apply chg directly  to the skin and wash  Gently with a scrungie or clean washcloth.  5.  Apply the CHG Soap to your body ONLY FROM THE NECK DOWN.   Do not use on face/ open                           Wound or open sores. Avoid contact with eyes, ears mouth and genitals (private parts).                       Wash face,  Genitals (private parts) with your normal soap.             6.  Wash thoroughly, paying special attention to the area where your surgery  will be performed.  7.  Thoroughly rinse your body with warm water from the neck down.  8.  DO NOT shower/wash with your normal soap after using and rinsing off  the CHG Soap.                9.  Pat yourself dry with a clean towel.            10.  Wear clean pajamas.            11.  Place clean sheets on your bed the night of your first shower and do not  sleep with pets. Day of Surgery : Do not apply any lotions/deodorants the morning of surgery.  Please wear clean clothes to the hospital/surgery center.  FAILURE TO FOLLOW THESE INSTRUCTIONS MAY RESULT IN THE CANCELLATION OF YOUR SURGERY PATIENT SIGNATURE_________________________________  NURSE SIGNATURE__________________________________  ________________________________________________________________________

## 2021-04-17 ENCOUNTER — Other Ambulatory Visit: Payer: Self-pay

## 2021-04-17 ENCOUNTER — Encounter (HOSPITAL_COMMUNITY)
Admission: RE | Admit: 2021-04-17 | Discharge: 2021-04-17 | Disposition: A | Discharge status: Home / Self Care | Payer: Medicare Other | Source: Ambulatory Visit | Attending: Urology | Admitting: Urology

## 2021-04-17 ENCOUNTER — Encounter (HOSPITAL_COMMUNITY): Payer: Self-pay

## 2021-04-17 VITALS — BP 167/96 | HR 72 | Temp 98.6°F | Resp 18 | Ht 72.0 in | Wt 231.0 lb

## 2021-04-17 DIAGNOSIS — R7303 Prediabetes: Secondary | ICD-10-CM

## 2021-04-17 DIAGNOSIS — Z01818 Encounter for other preprocedural examination: Secondary | ICD-10-CM

## 2021-04-17 DIAGNOSIS — Z01812 Encounter for preprocedural laboratory examination: Secondary | ICD-10-CM | POA: Diagnosis not present

## 2021-04-17 LAB — CBC
HCT: 43.6 % (ref 39.0–52.0)
Hemoglobin: 14.1 g/dL (ref 13.0–17.0)
MCH: 27.9 pg (ref 26.0–34.0)
MCHC: 32.3 g/dL (ref 30.0–36.0)
MCV: 86.2 fL (ref 80.0–100.0)
Platelets: 160 10*3/uL (ref 150–400)
RBC: 5.06 MIL/uL (ref 4.22–5.81)
RDW: 13.9 % (ref 11.5–15.5)
WBC: 7.1 10*3/uL (ref 4.0–10.5)
nRBC: 0 % (ref 0.0–0.2)

## 2021-04-17 LAB — BASIC METABOLIC PANEL
Anion gap: 7 (ref 5–15)
BUN: 23 mg/dL (ref 8–23)
CO2: 27 mmol/L (ref 22–32)
Calcium: 9.2 mg/dL (ref 8.9–10.3)
Chloride: 105 mmol/L (ref 98–111)
Creatinine, Ser: 1.17 mg/dL (ref 0.61–1.24)
GFR, Estimated: >60 mL/min (ref >60)
Glucose, Bld: 101 mg/dL — ABNORMAL HIGH (ref 70–99)
Potassium: 4.5 mmol/L (ref 3.5–5.1)
Sodium: 139 mmol/L (ref 135–145)

## 2021-04-17 LAB — GLUCOSE, CAPILLARY: Glucose-Capillary: 91 mg/dL (ref 70–99)

## 2021-04-17 LAB — HEMOGLOBIN A1C
Hgb A1c MFr Bld: 5.2 % (ref 4.8–5.6)
Mean Plasma Glucose: 102.54 mg/dL

## 2021-04-17 NOTE — Progress Notes (Signed)
COVID test- 05/05/21  Bowel prep reminder:NA  PCP - Dr. Garlon Hatchet Cardiologist - Dr. Linard Millers he no longer sees him  since PE in 2011  Chest x-ray - 2018-epic-pulmonary nodule, aortic atherosclerosis EKG - 09/14/20- epic Stress Test - no ECHO - 2011 Cardiac Cath - NA Pacemaker/ICD device last checked:NA  Sleep Study - no CPAP -   Fasting Blood Sugar - Pt denies pre diabetes Checks Blood Sugar _____ times a day  Blood Thinner Instructions:ASA  81/ Dr. Sabra Heck Aspirin Instructions:Stop 1 week prior to DOS/ Dr. Matilde Sprang Last Dose:05/01/21  Anesthesia review: yes  Patient denies shortness of breath, fever, cough and chest pain at PAT appointment Pt reports no SOB with activities. 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   Patient verbalized understanding of instructions that were given to them at the PAT appointment. Patient was also instructed that they will need to review over the PAT instructions again at home before surgery. yes

## 2021-05-03 NOTE — H&P (Signed)
I was consulted to assess the patient's urinary incontinence. 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 He has had prostatic urethral stones with a transurethral resection of the prostate and greenlight procedure a number years ago. 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 He had stones removed endoscopically in 2019. He passed the stones. In July 2022 he had circumferential calcification of the prostatic urethra that was very dense. He had multiple 1 to 2 cm stones removed from the bladder. 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 He was having worse incontinence afterwards.   Patient primary leaks all the time without awareness. He really does not have urge incontinence or bedwetting. He does not leak anymore if he coughs. Most of the urine is in the pad any voids very infrequently during the day. When he voids at night his flow was weak. He gets up once usually to urinate   He can soak 8-10 pads a day. He reports before the last procedure he was using 3 or 4 pads that were moderately wet   He is left-handed. He has had ventral hernia repair but no inguinal hernia repair with mesh   I reviewed the operative note and the dense calcification around the prostatic urethra also include a 2 cm stone. He had multiple 2 cm stones in the bladder. Holmium laser was utilized   Positive cough test in the standing position. It was dripping a little bit a urine just standing   Pathophysiology of urinary incontinence discussed. I agree that likely removing the stones unmasked his incontinence. The role of urodynamics and cystoscopy discussed. He understands and artificial sphincter would be the treatment of choice but would make future management of his stones much more challenging. I will speak to Dr. Lovena Neighbours regarding   I spoke to Dr. Lovena Neighbours and the patient's stone burden is quite high. A distant option is once a  year through the artificial sphincter to use ureteroscopy with laser to debulk the urethra. Conservative management will be strongly recommended  Spoke to Dr. Lovena Neighbours.   On urodynamics patient did not void and was catheterized for 25 mL. Maximum bladder capacity 240 mL. Bladder was unstable reaching pressures at 30 cm of water. It occurred at low bladder volumes and was ongoing. He inhibited the contraction. Patient did not leak with the urodynamics in place but leaked a mild amount with what appears to be a leak point pressure of approximately 60 cm of water. The patient actually may have lost bladder compliance as well as being overactive. There was some subtraction artifact in the filling phase. During voluntary voiding he voided 133 mL with a max of flow 2 mils per second. Max voiding pressure 48 cm of water. EMG activity decreased during voiding. No contrast used due to malfunction.   Patient has mixed incontinence. He does have a lot of bladder overactivity with small bladder capacity and possible loss of bladder compliance. He does have leak point pressures that are modest. I was surprised that he did not have a lot more stress incontinence even with a catheter in place. There may have been an element of obstruction if he does have a fixed bladder neck.   Patient will stay on daily Macrobid. He will stop oxybutynin. He will try Myrbetriq 50 mg samples and prescription. If this fails he will take the [facility]. I will perform cystoscopy in 8-week If they do not help  with medication we will talk about an artificial sphincter with a complicated plan noted above   TOday  Patient on daily Macrodantin clinically not infected. Gemtesa and Myrbetriq failed.  Patient underwent flexible cystoscopy. Penile bulbar urethra normal. He had a prostatic urethra approximately 2 to 3 cm in length. There was bullous edema. There was a small calcification at the bladder neck that was almost hiding at 10:00. There was a  small calcification at the level of verumontanum that was mildly visible at 3:00. The tissues looked inflamed. The bulbar urethra was normal. He had some mild cloudy urine. Urine looked cloudy and sent for culture  Picture was drawn. Artificial sphincter shown in full template discussed. He understands that normally I would not put a sphincter in under the circumstances. It would make management of his prostatic urethral stone burden very challenging. Regular ureteroscopy through the sphincter with relative rest is an option long-term if he had 1. A urinary diversion was recommended. Watchful waiting was also discussed. He has a challenging difficult problem to manage. He would really like to proceed with an artificial sphincter recognizing future risks with stone disease. I do not think he is ever gone into retention and he understands this would be a very difficult issue to deal with on the short-term. I will speak to Dr. Lovena Neighbours and I think it be very reasonable that we do some laser treatments on a regular basis with a deactivated sphincter recognizing relative rest with these procedure. I called in 10 days of Septra DS 1 tablet twice a day. He will start 3 days prior to surgery. I will call if culture positive. He understands his UTIs theoretically could increase risk of artificial sphincter infection. Increased risk of infection and erosion also highlighted. Rare injury and sequelae discussed. I do not think he needs any more debulking at this stage he is almost stone free and I do not want to cause any intraoperative bleeding or infection issues     ALLERGIES: Sudafed 12 Hour TB12 - Other Reaction, retention    MEDICATIONS: Myrbetriq 50 mg tablet, extended release 24 hr 1 tablet PO Daily  Nitrofurantoin Mono-Macro 100 mg capsule 1 capsule PO Daily  Oxybutynin Chloride Er 10 mg tablet, extended release 24 hr 1 tablet PO Daily  Tamsulosin Hcl 0.4 mg capsule 1 capsule PO Daily  Aspirin Ec 81 mg tablet,  delayed release  Atorvastatin Calcium  Losartan Potassium 100 mg tablet  Nitrofurantoin 100 mg capsule capsule PRN  Potassium Citrate Er 10 meq (1,080 mg) tablet, extended release 1 tablet PO BID  Vitamin D3 TABS Oral     GU PSH: Complex cystometrogram, w/ void pressure and urethral pressure profile studies, any technique - 01/31/2021 Complex Uroflow - 01/31/2021 Cysto Bladder Stone <2.5cm - 09/14/2020, 2016, 2014 Cystoscopy - 12/22/2020, 08/25/2020, 12/04/2018, 2019, 2019 Cystoscopy TURP - 2019, 2016, 2011 Emg surf Electrd - 01/31/2021 Intrabd voidng Press - 01/31/2021 Laser Surgery Prostate - 2014 Locm 300-399Mg /Ml Iodine,1Ml - 2018     NON-GU PSH: Appendectomy - 2011 Cataract Surgery.. Incisional hernia repair (open) - 2012, 2011 Laparoscopy, Surgical; Repair Incisional Or Ventral Hernia     GU PMH: BPH w/LUTS - 02/09/2021, - 2018, - 2018 Mixed incontinence (Stable) - 02/09/2021, - 01/31/2021, - 09/21/2020, - 08/25/2020, - 02/25/2020, - 2020 Bladder Stone - 12/30/2020, - 12/22/2020, - 09/21/2020, - 08/25/2020, - 2019, - 2018, - 2018, Bladder calculus, - 2016 Continuous incontinence - 12/30/2020, - 12/22/2020 History of prostate cancer - 12/22/2020, - 08/25/2020, -  02/25/2020, - 2020 Urge incontinence, The patient has complex voiding dysfunction. When I suspect is that his heavily encrusted prostatic urethra was allowing him to stay at least partially continent and then when the stones were removed from his recent cystoscopy, did open his bladder neck to the point where it was unable to collapse and keep him continent. - 2019 Acute Cystitis/UTI - 2019 Urinary Urgency (Worsening) - 2019, (Worsening), - 2019 Urinary Frequency (Worsening) - 2019, - 2018 (Worsening, Chronic), Will begin Rapaflo 8 mg 1 po daily. He will CIC PRN as he has done in the past if he feels he needs. Recommend at least CIC at HS. F/U as scheduled for both CT and cysto, - 2018, Urinary frequency, - 2016 Prostate Cancer  - 2018, - 2018, Prostate cancer, - 2017 Gross hematuria (Stable) - 2018, Gross hematuria, - 2014 Nocturia - 2018 Urinary Tract Inf, Unspec site, Pyuria - 2017 Stress Incontinence, Male stress incontinence - 2016 Prostate Stones, Calculus, prostate - 2016 Dysuria, Dysuria - 2015 Hemorrhagic cystitis (w/o hematuria), Cystitis - 2015 Weak Urinary Stream, Weak urinary stream - 2015 Urinary Retention, Unspec, Urinary retention - 2014      PMH Notes:  2010-11-28 14:42:05 - Note: Pulmonary Embolism   s/p TURP and holmium laser prostatic calcification 03/08/14. Pathology: small focus of adenocarcinoma of prostate Gleason 3+3=6. He is having some occasional stress & urge incontinence, which is resolving.    Hx of bladder stones & passed stones in Sept 2015. He was last seen by Jimmey Ralph, NP on 10/21/13 for an overdue 6 month office visit. In April 2015, had weak stream with frequency but a few days later was able to pass some stone fragments and voiding issues resolved. He is continuing to pass stones intermittently. He is able to urinate without too much difficulty.    Hx of intermittent gross hematuria up until Sept 2014. In Oct, he started noticing that he would have a weak stream & then he would pass something (? stone).    S/P Green light procedure & cystolithopaxy on 05/26/12. He states that he has frequency, mixed incontinence, but a good stream. He has passed some prostatic stones and some necrotic tissue.    Hx intermittent gross hematuria and blood in stools X 2 months. Cystoscopy on 04/23/12 showed a bladder stone & bladder trabeculation. He saw Dr. Penelope Coop in reference to rectal bleeding & had a colonoscopy on 04/02/12. Does have h/o tobacco use but none in >47 yrs. H/O chemical exposure (worked in [employer]) and IMRT X 40 sessions 2012-13.    Hx of prostate cancer. S/P TUR-P for urinary retention on 05/09/09, with path showing G 3+3=6 CaP, <2% of the tissue.     NON-GU PMH: Other  nonspecific abnormal finding of lung field - 2018 Encounter for general adult medical examination without abnormal findings, Encounter for preventive health examination - 2017 Muscle weakness (generalized), Muscle weakness - 2016 Other lack of coordination, Muscular incoordination - 2016 Melena, Hematochezia - 2014 Personal history of other endocrine, nutritional and metabolic disease, History of hypercholesterolemia - 2014    FAMILY HISTORY: Acute Myocardial Infarction - Father Father Deceased At Age58 ___ - [place] In Family Mother Deceased At Age 1 from diabetic complicati - Runs In Family Stroke Syndrome - Sister, Mother   SOCIAL HISTORY: Marital Status: Married Preferred Language: English; Ethnicity: Not Hispanic Or Latino; Race: White Current Smoking Status: Patient does not smoke anymore. Has not smoked since 04/12/1965. Smoked for 5 years. Smoked 1 pack  per day.   Tobacco Use Assessment Completed: Used Tobacco in last 30 days? Has never drank.  Drinks 1 caffeinated drink per day. Patient's occupation is/was Retired.     Notes: 2 sons, 2 daughters   REVIEW OF SYSTEMS:    GU Review Male:   Patient denies frequent urination, hard to postpone urination, burning/ pain with urination, get up at night to urinate, leakage of urine, stream starts and stops, trouble starting your stream, have to strain to urinate , erection problems, and penile pain.  Gastrointestinal (Upper):   Patient denies nausea, vomiting, and indigestion/ heartburn.  Gastrointestinal (Lower):   Patient denies diarrhea and constipation.  Constitutional:   Patient denies fever, night sweats, weight loss, and fatigue.  Skin:   Patient denies skin rash/ lesion and itching.  Eyes:   Patient denies blurred vision and double vision.  Ears/ Nose/ Throat:   Patient denies sore throat and sinus problems.  Hematologic/Lymphatic:   Patient denies swollen glands and easy bruising.  Cardiovascular:   Patient denies leg swelling  and chest pains.  Respiratory:   Patient denies cough and shortness of breath.  Endocrine:   Patient denies excessive thirst.  Musculoskeletal:   Patient denies back pain and joint pain.  Neurological:   Patient denies headaches and dizziness.  Psychologic:   Patient denies depression and anxiety.   VITAL SIGNS: None   Complexity of Data:   08/18/20 08/24/19 09/16/18 07/09/17 01/18/17 05/01/16 03/17/15 09/23/14  PSA  Total PSA 0.23 ng/mL 0.32 ng/mL 0.25 ng/mL 0.28 ng/mL 0.35 ng/mL 0.42 ng/dl 0.50  0.46     PROCEDURES:         Flexible Cystoscopy - 52000  Risks, benefits, and some of the potential complications of the procedure were discussed at length with the patient. All questions were answered. Informed consent was obtained. Sterile technique and intraurethral analgesia were used.  Prostate:  Patient underwent flexible cystoscopy. Penile bulbar urethra normal. He had a prostatic urethra approximately 2 to 3 cm in length. There was bullous edema. There was a small calcification at the bladder neck that was almost hiding at 10:00. There was a small calcification at the level of verumontanum that was mildly visible at 3:00. The tissues looked inflamed. The bulbar urethra was normal. He had some mild cloudy urine. Urine looked cloudy and sent for culture      The lower urinary tract was carefully examined. The procedure was well-tolerated and without complications. Instructions were given to call the office immediately for bloody urine, difficulty urinating, urinary retention, painful or frequent urination, fever, chills, nausea, vomiting or other illness. The patient stated that he understood these instructions and would comply with them.         Urinalysis w/Scope - 81001 Dipstick Dipstick Cont'd Micro  Color: Yellow Bilirubin: Neg mg/dL WBC/hpf: 40 - 60/hpf  Appearance: Cloudy Ketones: Trace mg/dL RBC/hpf: 10 - 20/hpf  Specific Gravity: >1.030 Blood: 1+ ery/uL Bacteria: NS (Not Seen)   pH: 5.5 Protein: 3+ mg/dL Cystals: NS (Not Seen)  Glucose: Neg mg/dL Urobilinogen: 2.0 mg/dL Casts: NS (Not Seen)    Nitrites: Neg Trichomonas: Not Present    Leukocyte Esterase: 3+ leu/uL Mucous: Present      Epithelial Cells: 6 - 10/hpf      Yeast: NS (Not Seen)      Sperm: Not Present    Notes: unspun specimen due to quantity    ASSESSMENT:      ICD-10 Details  1 GU:   Mixed incontinence -  N39.46   2   Urinary Frequency - R35.0               Notes:   I drew him a picture and we talked about an artificial sphincter in detail. Pros, cons, general surgical and anesthetic risks, and other options including behavioral therapy, the male sling, and watchful waiting were discussed. He understands that sphincters are successful in 80-90% of cases for stress incontinence (dry in approximately ), 50% for urge incontinence, and that in a small % of cases the incontinence can worsen. Surgical risks were described but not limited to the discussion of injury to neighboring structures including the bowel (with possible life-threatening sepsis and colostomy), and bladder, urethra (all resulting in further surgery). The risk of urethral erosion and infection were discussed with sequelae. The risks of malfunction, atrophy, and hernia/skin erosion and management were discussed. Bleeding risks with transfusion rates and risks of perineal numbness and erectile dysfunction were discussed. The risks of urinary retention requiring catheterization and slowing of urinary stream were discussed. We talked about injury to nerves/soft tissue leading to debilitating and intractable pelvic, abdominal, and lower extremity pain syndromes and neuropathies. The usual post-operative course and time of activation was described. The patient understands that he might not reach his treatment goal and that he might be worse following surgery.    PLAN:

## 2021-05-05 ENCOUNTER — Other Ambulatory Visit: Payer: Self-pay

## 2021-05-05 ENCOUNTER — Encounter (HOSPITAL_COMMUNITY)
Admission: RE | Admit: 2021-05-05 | Discharge: 2021-05-05 | Disposition: A | Discharge status: Home / Self Care | Payer: Medicare Other | Source: Ambulatory Visit | Attending: Urology | Admitting: Urology

## 2021-05-05 DIAGNOSIS — Z01818 Encounter for other preprocedural examination: Secondary | ICD-10-CM

## 2021-05-05 DIAGNOSIS — Z01812 Encounter for preprocedural laboratory examination: Secondary | ICD-10-CM | POA: Insufficient documentation

## 2021-05-05 DIAGNOSIS — Z20822 Contact with and (suspected) exposure to covid-19: Secondary | ICD-10-CM | POA: Insufficient documentation

## 2021-05-05 LAB — SARS CORONAVIRUS 2 (TAT 6-24 HRS): SARS Coronavirus 2: NEGATIVE

## 2021-05-08 NOTE — Anesthesia Preprocedure Evaluation (Addendum)
Anesthesia Evaluation  Patient identified by MRN, date of birth, ID band Patient awake    Reviewed: Allergy & Precautions, H&P , NPO status , Patient's Chart, lab work & pertinent test results  Airway Mallampati: II  TM Distance: >3 FB Neck ROM: Full    Dental no notable dental hx. (+) Partial Upper, Partial Lower, Dental Advisory Given   Pulmonary neg pulmonary ROS, former smoker,    Pulmonary exam normal breath sounds clear to auscultation       Cardiovascular Exercise Tolerance: Good hypertension, Pt. on medications + dysrhythmias Atrial Fibrillation  Rhythm:Regular Rate:Normal     Neuro/Psych negative neurological ROS  negative psych ROS   GI/Hepatic negative GI ROS, Neg liver ROS,   Endo/Other  negative endocrine ROS  Renal/GU negative Renal ROS  negative genitourinary   Musculoskeletal   Abdominal   Peds  Hematology negative hematology ROS (+)   Anesthesia Other Findings   Reproductive/Obstetrics negative OB ROS                            Anesthesia Physical Anesthesia Plan  ASA: 3  Anesthesia Plan: General   Post-op Pain Management: Tylenol PO (pre-op)*   Induction: Intravenous  PONV Risk Score and Plan: 3 and Ondansetron, Dexamethasone and Treatment may vary due to age or medical condition  Airway Management Planned: LMA  Additional Equipment:   Intra-op Plan:   Post-operative Plan: Extubation in OR  Informed Consent: I have reviewed the patients History and Physical, chart, labs and discussed the procedure including the risks, benefits and alternatives for the proposed anesthesia with the patient or authorized representative who has indicated his/her understanding and acceptance.     Dental advisory given  Plan Discussed with: CRNA  Anesthesia Plan Comments:        Anesthesia Quick Evaluation

## 2021-05-09 ENCOUNTER — Encounter (HOSPITAL_COMMUNITY)
Admission: RE | Disposition: A | Discharge status: Home / Self Care | Payer: Self-pay | Source: Ambulatory Visit | Attending: Urology

## 2021-05-09 ENCOUNTER — Ambulatory Visit (HOSPITAL_COMMUNITY): Payer: Medicare Other | Admitting: Physician Assistant

## 2021-05-09 ENCOUNTER — Observation Stay (HOSPITAL_COMMUNITY)
Admission: RE | Admit: 2021-05-09 | Discharge: 2021-05-10 | Disposition: A | Discharge status: Home / Self Care | Payer: Medicare Other | Source: Ambulatory Visit | Attending: Urology | Admitting: Urology

## 2021-05-09 ENCOUNTER — Encounter (HOSPITAL_COMMUNITY): Payer: Self-pay | Admitting: Urology

## 2021-05-09 ENCOUNTER — Other Ambulatory Visit: Payer: Self-pay

## 2021-05-09 ENCOUNTER — Ambulatory Visit (HOSPITAL_BASED_OUTPATIENT_CLINIC_OR_DEPARTMENT_OTHER): Payer: Medicare Other | Admitting: Certified Registered Nurse Anesthetist

## 2021-05-09 DIAGNOSIS — N3946 Mixed incontinence: Principal | ICD-10-CM | POA: Insufficient documentation

## 2021-05-09 DIAGNOSIS — N393 Stress incontinence (female) (male): Secondary | ICD-10-CM

## 2021-05-09 DIAGNOSIS — I4891 Unspecified atrial fibrillation: Secondary | ICD-10-CM

## 2021-05-09 DIAGNOSIS — I10 Essential (primary) hypertension: Secondary | ICD-10-CM | POA: Diagnosis not present

## 2021-05-09 DIAGNOSIS — Z7982 Long term (current) use of aspirin: Secondary | ICD-10-CM | POA: Insufficient documentation

## 2021-05-09 DIAGNOSIS — Z8546 Personal history of malignant neoplasm of prostate: Secondary | ICD-10-CM | POA: Insufficient documentation

## 2021-05-09 DIAGNOSIS — R32 Unspecified urinary incontinence: Secondary | ICD-10-CM | POA: Diagnosis present

## 2021-05-09 DIAGNOSIS — Z79899 Other long term (current) drug therapy: Secondary | ICD-10-CM | POA: Diagnosis not present

## 2021-05-09 DIAGNOSIS — R350 Frequency of micturition: Secondary | ICD-10-CM | POA: Insufficient documentation

## 2021-05-09 HISTORY — PX: URINARY SPHINCTER IMPLANT: SHX2624

## 2021-05-09 LAB — HEMOGLOBIN AND HEMATOCRIT, BLOOD
HCT: 38.8 % — ABNORMAL LOW (ref 39.0–52.0)
Hemoglobin: 12.5 g/dL — ABNORMAL LOW (ref 13.0–17.0)

## 2021-05-09 LAB — GLUCOSE, CAPILLARY: Glucose-Capillary: 103 mg/dL — ABNORMAL HIGH (ref 70–99)

## 2021-05-09 SURGERY — INSERTION, ARTIFICIAL URINARY SPHINCTER
Anesthesia: General

## 2021-05-09 MED ORDER — ROCURONIUM BROMIDE 10 MG/ML (PF) SYRINGE
PREFILLED_SYRINGE | INTRAVENOUS | Status: DC | PRN
Start: 2021-05-09 — End: 2021-05-09
  Administered 2021-05-09 (×2): 20 mg via INTRAVENOUS
  Administered 2021-05-09: 60 mg via INTRAVENOUS

## 2021-05-09 MED ORDER — SODIUM CHLORIDE (PF) 0.9 % IJ SOLN
INTRAMUSCULAR | Status: DC | PRN
  Administered 2021-05-09: 250 mL

## 2021-05-09 MED ORDER — DIPHENHYDRAMINE HCL 50 MG/ML IJ SOLN
12.5000 mg | Freq: Four times a day (QID) | INTRAMUSCULAR | Status: DC | PRN
Start: 2021-05-09 — End: 2021-05-10

## 2021-05-09 MED ORDER — CHLORHEXIDINE GLUCONATE CLOTH 2 % EX PADS
6.0000 | MEDICATED_PAD | Freq: Every day | CUTANEOUS | Status: DC
  Administered 2021-05-09: 6 via TOPICAL

## 2021-05-09 MED ORDER — LIDOCAINE HCL (PF) 2 % IJ SOLN
INTRAMUSCULAR | Status: AC
  Filled 2021-05-09: qty 5

## 2021-05-09 MED ORDER — EPHEDRINE 5 MG/ML INJ
INTRAVENOUS | Status: AC
  Filled 2021-05-09: qty 5

## 2021-05-09 MED ORDER — LIDOCAINE 2% (20 MG/ML) 5 ML SYRINGE
INTRAMUSCULAR | Status: DC | PRN
  Administered 2021-05-09: 60 mg via INTRAVENOUS

## 2021-05-09 MED ORDER — FENTANYL CITRATE PF 50 MCG/ML IJ SOSY: 25.0000 ug | PREFILLED_SYRINGE | INTRAMUSCULAR | Status: DC | PRN

## 2021-05-09 MED ORDER — ONDANSETRON HCL 4 MG/2ML IJ SOLN: 4.0000 mg | INTRAMUSCULAR | Status: DC | PRN

## 2021-05-09 MED ORDER — PHENYLEPHRINE HCL-NACL 20-0.9 MG/250ML-% IV SOLN
INTRAVENOUS | Status: DC | PRN
  Administered 2021-05-09: 35 ug/min via INTRAVENOUS

## 2021-05-09 MED ORDER — ONDANSETRON HCL 4 MG/2ML IJ SOLN
INTRAMUSCULAR | Status: AC
  Filled 2021-05-09: qty 2

## 2021-05-09 MED ORDER — ARTIFICIAL TEARS OPHTHALMIC OINT
TOPICAL_OINTMENT | OPHTHALMIC | Status: AC
  Filled 2021-05-09: qty 3.5

## 2021-05-09 MED ORDER — EPHEDRINE SULFATE-NACL 50-0.9 MG/10ML-% IV SOSY
PREFILLED_SYRINGE | INTRAVENOUS | Status: DC | PRN
  Administered 2021-05-09: 5 mg via INTRAVENOUS

## 2021-05-09 MED ORDER — OXYCODONE HCL 5 MG PO TABS
ORAL_TABLET | ORAL | Status: AC
  Administered 2021-05-09: 5 mg via ORAL
  Filled 2021-05-09: qty 1

## 2021-05-09 MED ORDER — HYDROMORPHONE HCL 1 MG/ML IJ SOLN: 0.5000 mg | INTRAMUSCULAR | Status: DC | PRN

## 2021-05-09 MED ORDER — BACITRACIN-NEOMYCIN-POLYMYXIN 400-5-5000 EX OINT: 1.0000 "application " | TOPICAL_OINTMENT | Freq: Three times a day (TID) | CUTANEOUS | Status: DC | PRN

## 2021-05-09 MED ORDER — ONDANSETRON HCL 4 MG/2ML IJ SOLN
INTRAMUSCULAR | Status: DC | PRN
  Administered 2021-05-09: 4 mg via INTRAVENOUS

## 2021-05-09 MED ORDER — LOSARTAN POTASSIUM 50 MG PO TABS
100.0000 mg | ORAL_TABLET | Freq: Every day | ORAL | Status: DC
  Administered 2021-05-09 – 2021-05-10 (×2): 100 mg via ORAL
  Filled 2021-05-09 (×2): qty 2

## 2021-05-09 MED ORDER — ORAL CARE MOUTH RINSE: 15.0000 mL | Freq: Once | OROMUCOSAL | Status: AC

## 2021-05-09 MED ORDER — DOCUSATE SODIUM 100 MG PO CAPS
100.0000 mg | ORAL_CAPSULE | Freq: Two times a day (BID) | ORAL | Status: DC
  Administered 2021-05-09 – 2021-05-10 (×2): 100 mg via ORAL
  Filled 2021-05-09 (×2): qty 1

## 2021-05-09 MED ORDER — ACETAMINOPHEN 500 MG PO TABS
1000.0000 mg | ORAL_TABLET | Freq: Once | ORAL | Status: AC
  Administered 2021-05-09: 1000 mg via ORAL
  Filled 2021-05-09: qty 2

## 2021-05-09 MED ORDER — PROPOFOL 10 MG/ML IV BOLUS
INTRAVENOUS | Status: AC
  Filled 2021-05-09: qty 20

## 2021-05-09 MED ORDER — ATORVASTATIN CALCIUM 10 MG PO TABS
10.0000 mg | ORAL_TABLET | Freq: Every morning | ORAL | Status: DC
  Administered 2021-05-09 – 2021-05-10 (×2): 10 mg via ORAL
  Filled 2021-05-09 (×2): qty 1

## 2021-05-09 MED ORDER — TAMSULOSIN HCL 0.4 MG PO CAPS
0.4000 mg | ORAL_CAPSULE | Freq: Every morning | ORAL | Status: DC
  Administered 2021-05-09 – 2021-05-10 (×2): 0.4 mg via ORAL
  Filled 2021-05-09 (×2): qty 1

## 2021-05-09 MED ORDER — PROPOFOL 10 MG/ML IV BOLUS
INTRAVENOUS | Status: DC | PRN
  Administered 2021-05-09: 200 mg via INTRAVENOUS

## 2021-05-09 MED ORDER — FENTANYL CITRATE (PF) 100 MCG/2ML IJ SOLN
INTRAMUSCULAR | Status: DC | PRN
  Administered 2021-05-09: 100 ug via INTRAVENOUS
  Administered 2021-05-09 (×2): 25 ug via INTRAVENOUS

## 2021-05-09 MED ORDER — HYOSCYAMINE SULFATE 0.125 MG SL SUBL
0.1250 mg | SUBLINGUAL_TABLET | SUBLINGUAL | Status: DC | PRN
  Filled 2021-05-09: qty 1

## 2021-05-09 MED ORDER — DEXTROSE 5 % IV SOLN
5.0000 mg/kg | INTRAVENOUS | Status: AC
  Administered 2021-05-09: 430 mg via INTRAVENOUS
  Filled 2021-05-09: qty 10.75

## 2021-05-09 MED ORDER — DEXTROSE-NACL 5-0.45 % IV SOLN: INTRAVENOUS | Status: DC

## 2021-05-09 MED ORDER — DIPHENHYDRAMINE HCL 12.5 MG/5ML PO ELIX: 12.5000 mg | ORAL_SOLUTION | Freq: Four times a day (QID) | ORAL | Status: DC | PRN

## 2021-05-09 MED ORDER — FENTANYL CITRATE (PF) 100 MCG/2ML IJ SOLN
INTRAMUSCULAR | Status: AC
  Filled 2021-05-09: qty 2

## 2021-05-09 MED ORDER — FENTANYL CITRATE (PF) 100 MCG/2ML IJ SOLN
INTRAMUSCULAR | Status: AC
Start: 2021-05-09 — End: ?
  Filled 2021-05-09: qty 2

## 2021-05-09 MED ORDER — STERILE WATER FOR IRRIGATION IR SOLN
Status: DC | PRN
  Administered 2021-05-09: 500 mL

## 2021-05-09 MED ORDER — ACETAMINOPHEN 500 MG PO TABS
1000.0000 mg | ORAL_TABLET | Freq: Four times a day (QID) | ORAL | Status: AC
  Administered 2021-05-09 – 2021-05-10 (×4): 1000 mg via ORAL
  Filled 2021-05-09 (×4): qty 2

## 2021-05-09 MED ORDER — 0.9 % SODIUM CHLORIDE (POUR BTL) OPTIME
TOPICAL | Status: DC | PRN
  Administered 2021-05-09: 1000 mL

## 2021-05-09 MED ORDER — CHLORHEXIDINE GLUCONATE 0.12 % MT SOLN
15.0000 mL | Freq: Once | OROMUCOSAL | Status: AC
  Administered 2021-05-09: 15 mL via OROMUCOSAL

## 2021-05-09 MED ORDER — HYDROCODONE-ACETAMINOPHEN 5-325 MG PO TABS: 1.0000 | ORAL_TABLET | ORAL | 0 refills | Status: AC | PRN

## 2021-05-09 MED ORDER — LACTATED RINGERS IV SOLN: INTRAVENOUS | Status: DC

## 2021-05-09 MED ORDER — SUGAMMADEX SODIUM 200 MG/2ML IV SOLN
INTRAVENOUS | Status: DC | PRN
Start: 2021-05-09 — End: 2021-05-09
  Administered 2021-05-09: 200 mg via INTRAVENOUS

## 2021-05-09 MED ORDER — OXYCODONE HCL 5 MG PO TABS: 5.0000 mg | ORAL_TABLET | ORAL | Status: DC | PRN

## 2021-05-09 MED ORDER — CEFAZOLIN SODIUM-DEXTROSE 2-4 GM/100ML-% IV SOLN
2.0000 g | INTRAVENOUS | Status: AC
  Administered 2021-05-09: 2 g via INTRAVENOUS
  Filled 2021-05-09: qty 100

## 2021-05-09 SURGICAL SUPPLY — 55 items
ADH SKN CLS APL DERMABOND .7 (GAUZE/BANDAGES/DRESSINGS) ×2
BAG COUNTER SPONGE SURGICOUNT (BAG) IMPLANT
BAG DECANTER FOR FLEXI CONT (MISCELLANEOUS) ×3 IMPLANT
BAG DRN RND TRDRP ANRFLXCHMBR (UROLOGICAL SUPPLIES) ×1
BAG SPNG CNTER NS LX DISP (BAG)
BAG URINE DRAIN 2000ML AR STRL (UROLOGICAL SUPPLIES) ×3 IMPLANT
BALLOON PRESSURE REGUL 61 70CM (Miscellaneous) IMPLANT
BLADE SURG 15 STRL LF DISP TIS (BLADE) ×4 IMPLANT
BLADE SURG 15 STRL SS (BLADE) ×4
BNDG CONFORM 4 STRL LF (GAUZE/BANDAGES/DRESSINGS) ×1 IMPLANT
BNDG GAUZE ELAST 4 BULKY (GAUZE/BANDAGES/DRESSINGS) ×3 IMPLANT
CATH FOLEY 2WAY SLVR  5CC 14FR (CATHETERS) ×2
CATH FOLEY 2WAY SLVR 5CC 14FR (CATHETERS) ×2 IMPLANT
CONTROL PUMP (Urological Implant) ×1 IMPLANT
COVER MAYO STAND STRL (DRAPES) ×3 IMPLANT
CUFF URINARY OCCL 4. IZ (Miscellaneous) ×1 IMPLANT
DERMABOND ADVANCED (GAUZE/BANDAGES/DRESSINGS) ×2
DERMABOND ADVANCED .7 DNX12 (GAUZE/BANDAGES/DRESSINGS) ×4 IMPLANT
DISSECTOR ROUND CHERRY 3/8 STR (MISCELLANEOUS) ×3 IMPLANT
DRAPE SHEET LG 3/4 BI-LAMINATE (DRAPES) ×2 IMPLANT
DRAPE UNDERBUTTOCKS STRL (DISPOSABLE) ×3 IMPLANT
DRSG TEGADERM 4X4.75 (GAUZE/BANDAGES/DRESSINGS) ×6 IMPLANT
DRSG TELFA 3X8 NADH (GAUZE/BANDAGES/DRESSINGS) ×2 IMPLANT
ELECT REM PT RETURN 15FT ADLT (MISCELLANEOUS) ×3 IMPLANT
GAUZE 4X4 16PLY ~~LOC~~+RFID DBL (SPONGE) ×6 IMPLANT
GLOVE SURG ENC MOIS LTX SZ6.5 (GLOVE) ×3 IMPLANT
GLOVE SURG ENC TEXT LTX SZ7.5 (GLOVE) ×3 IMPLANT
GOWN STRL REUS W/TWL LRG LVL3 (GOWN DISPOSABLE) ×3 IMPLANT
GOWN STRL REUS W/TWL XL LVL3 (GOWN DISPOSABLE) ×3 IMPLANT
KIT ACCESSORY AMS 800 ×1 IMPLANT
KIT BASIN OR (CUSTOM PROCEDURE TRAY) ×3 IMPLANT
KIT TURNOVER KIT A (KITS) IMPLANT
LOOP VESSEL MAXI BLUE (MISCELLANEOUS) ×3 IMPLANT
PACK CYSTO (CUSTOM PROCEDURE TRAY) ×3 IMPLANT
PAD DRESSING TELFA 3X8 NADH (GAUZE/BANDAGES/DRESSINGS) ×2 IMPLANT
PANTS MESH DISP LRG (UNDERPADS AND DIAPERS) ×2 IMPLANT
PANTS MESH DISPOSABLE L (UNDERPADS AND DIAPERS) ×1
PENCIL SMOKE EVACUATOR (MISCELLANEOUS) IMPLANT
PLUG CATH AND CAP STER (CATHETERS) ×3 IMPLANT
PRESS REG BALL 61 70CM (Miscellaneous) ×2 IMPLANT
PROTECTOR NERVE ULNAR (MISCELLANEOUS) ×3 IMPLANT
SHEET LAVH (DRAPES) ×3 IMPLANT
SPIKE FLUID TRANSFER (MISCELLANEOUS) ×9 IMPLANT
SUT MNCRL AB 4-0 PS2 18 (SUTURE) ×6 IMPLANT
SUT SILK 0 FSL (SUTURE) ×3 IMPLANT
SUT VIC AB 0 CT1 27 (SUTURE) ×4
SUT VIC AB 0 CT1 27XBRD ANTBC (SUTURE) ×2 IMPLANT
SUT VIC AB 3-0 SH 27 (SUTURE) ×8
SUT VIC AB 3-0 SH 27X BRD (SUTURE) ×8 IMPLANT
SYR 10ML LL (SYRINGE) ×6 IMPLANT
SYR 20ML LL LF (SYRINGE) ×3 IMPLANT
SYR 30ML LL (SYRINGE) ×3 IMPLANT
TOWEL OR 17X26 10 PK STRL BLUE (TOWEL DISPOSABLE) ×3 IMPLANT
TOWEL OR NON WOVEN STRL DISP B (DISPOSABLE) ×3 IMPLANT
TUBING CONNECTING 10 (TUBING) ×3 IMPLANT

## 2021-05-09 NOTE — Transfer of Care (Signed)
Immediate Anesthesia Transfer of Care Note  Patient: Robert Miller  Procedure(s) Performed: ARTIFICIAL URINARY SPHINCTER  Patient Location: PACU  Anesthesia Type:General  Level of Consciousness: awake, alert  and oriented  Airway & Oxygen Therapy: Patient Spontanous Breathing and Patient connected to face mask oxygen  Post-op Assessment: Report given to RN and Post -op Vital signs reviewed and stable  Post vital signs: Reviewed and stable  Last Vitals:  Vitals Value Taken Time  BP 181/98 05/09/21 0956  Temp    Pulse 70 05/09/21 0959  Resp 12 05/09/21 0957  SpO2 100 % 05/09/21 0959  Vitals shown include unvalidated device data.  Last Pain:  Vitals:   05/09/21 0554  TempSrc:   PainSc: 0-No pain         Complications: No notable events documented.

## 2021-05-09 NOTE — Anesthesia Procedure Notes (Signed)
Procedure Name: Intubation Date/Time: 05/09/2021 7:42 AM Performed by: Maxwell Caul, CRNA Pre-anesthesia Checklist: Patient identified, Emergency Drugs available, Suction available and Patient being monitored Patient Re-evaluated:Patient Re-evaluated prior to induction Oxygen Delivery Method: Circle system utilized Preoxygenation: Pre-oxygenation with 100% oxygen Induction Type: IV induction Ventilation: Mask ventilation without difficulty Laryngoscope Size: Mac and 4 Grade View: Grade II Tube type: Oral Tube size: 7.5 mm Number of attempts: 1 Airway Equipment and Method: Stylet and Oral airway Placement Confirmation: ETT inserted through vocal cords under direct vision, positive ETCO2 and breath sounds checked- equal and bilateral Secured at: 21 cm Tube secured with: Tape Dental Injury: Teeth and Oropharynx as per pre-operative assessment

## 2021-05-09 NOTE — Anesthesia Postprocedure Evaluation (Signed)
Anesthesia Post Note  Patient: Robert Miller  Procedure(s) Performed: ARTIFICIAL URINARY SPHINCTER     Patient location during evaluation: PACU Anesthesia Type: General Level of consciousness: awake and alert Pain management: pain level controlled Vital Signs Assessment: post-procedure vital signs reviewed and stable Respiratory status: spontaneous breathing, nonlabored ventilation and respiratory function stable Cardiovascular status: blood pressure returned to baseline and stable Postop Assessment: no apparent nausea or vomiting Anesthetic complications: no   No notable events documented.  Last Vitals:  Vitals:   05/09/21 1030 05/09/21 1130  BP: (!) 167/96 (!) 158/81  Pulse: 71 66  Resp: 14 12  Temp:    SpO2: 98% 98%    Last Pain:  Vitals:   05/09/21 1130  TempSrc:   PainSc: 0-No pain                 Lovis More,W. EDMOND

## 2021-05-09 NOTE — Progress Notes (Signed)
Patient ambulated in hall with assistance around half the length of the unit. No walker needed; steady gait.

## 2021-05-09 NOTE — Interval H&P Note (Signed)
History and Physical Interval Note:  05/09/2021 7:04 AM  Lauris Chroman  has presented today for surgery, with the diagnosis of stress incontinence.  The various methods of treatment have been discussed with the patient and family. After consideration of risks, benefits and other options for treatment, the patient has consented to  Procedure(s) with comments: ARTIFICIAL URINARY SPHINCTER CYSTOSCOPY (N/A) - 90 MINS FOR CASE as a surgical intervention.  The patient's history has been reviewed, patient examined, no change in status, stable for surgery.  I have reviewed the patient's chart and labs.  Questions were answered to the patient's satisfaction.     Keirstin Musil A Montrelle Eddings

## 2021-05-09 NOTE — Progress Notes (Signed)
Extremely Hard of hearing.  Pt states his wife is in waiting area and has his hearing aids.

## 2021-05-09 NOTE — Discharge Instructions (Addendum)
As discussed with Dr. Matilde Sprang.I have reviewed discharge instructions in detail with the patient. They will follow-up with me or their physician as scheduled. My nurse will also be calling the patients as per protocol.

## 2021-05-09 NOTE — Op Note (Signed)
Preoperative diagnosis: Stress urinary incontinence Postoperative diagnosis: Stress urinary incontinence Surgery: Implantation of artificial urinary sphincter Surgeon: Dr. Nicki Reaper Kemia Wendel Assistant Debbrah Alar  The patient has the above diagnosis and consented to the above procedure.  Extra care was taken preoperatively to sterilize his urine.  Throughout the case we kept all urine off the field with Foley catheter and catheter plug.  Preoperative antibiotics were given.  Extra care was taken with leg positioning to minimize the risk compartment syndrome and neuropathy and deep vein thrombosis  Usual skin preparation was utilized including 10-minute scrub  I made a 5 and half centimeter perineal incision and carried down through subcutaneous tissue mobilizing the bulbospongiosus muscle.  The patient was very deep and I used my usual retractors.  I split the bulbospongiosus muscle in the midline and mobilized it from the bulbar urethra at the bifurcation of corporal body.  I used my box technique to mobilize the bulbar urethra.  With my technique I passed a right angle at the level of the corporal bodies through the sail of tissue delivering its tip with cutting current.  Vessey loop was applied.  I then opened the window with scissors with my usual technique.  Moist Ray-Tec was applied.  Opening was appropriate size at the bifurcation of the corporal body.  It was obvious he would benefit from a 4 cm cuff based upon visual size of the urethra  Using appropriate bony and soft tissue landmarks I made a 6 cm left lower quadrant incision carried down through subcutaneous tissue and mobilizing it off the fascia.  Scalpel incision was made in fashion.  I split the fascia with scissors appropriate length.  I used a large Kelly to separate the external and internal oblique muscle and finger dissected to the appropriate level extraperitoneal below the muscle.  I placed the well-prepared reservoir and filled  it with 25 cc of normal saline with the described technique  He did not have a large scrotum.  I marked the most dependent area on the left side.  Using my retractor I brought the left hemiscrotum up approaching the abdominal incision after I had finger dissected a nice opening between the incision and high left hemiscrotum.  I used a peanut to mobilize soft tissue off my finger tip to make a nice subdartos pouch.  I delivered a well-prepared pump in appropriate orientation to the left hemiscrotum.  It laid in very nicely  I then passed the 4 cm cuff atraumatically around the bulbar urethra.  It was fashioned with the usual technique.  The tubing was passed underneath the bulbospongiosus muscle up through the left lower quadrant incision with a passing needle.  The cuff was in excellent position and good orientation.  All 4 tubings were connected with the described technique.  The device was cycled 3 times and left half dimpled deactivated  I closed the left lower quadrant with running 3-0 subcutaneous tissue followed by 4-0 subcuticular.  I closed the perineal incision with 3 layers of 3-0 Vicryl closing the bulbospongiosus muscle and then soft tissue in layers.  I closed the skin with running 4-0 Monocryl..  Sterile dressing was applied.  Leg position was excellent.  Urine output was good.  No blood in urine.  Hopefully the patient will reach his treatment goal I was very pleased with the surgery

## 2021-05-10 ENCOUNTER — Encounter (HOSPITAL_COMMUNITY): Payer: Self-pay | Admitting: Urology

## 2021-05-10 DIAGNOSIS — Z7982 Long term (current) use of aspirin: Secondary | ICD-10-CM | POA: Diagnosis not present

## 2021-05-10 DIAGNOSIS — N3946 Mixed incontinence: Secondary | ICD-10-CM | POA: Diagnosis not present

## 2021-05-10 DIAGNOSIS — Z79899 Other long term (current) drug therapy: Secondary | ICD-10-CM | POA: Diagnosis not present

## 2021-05-10 DIAGNOSIS — R350 Frequency of micturition: Secondary | ICD-10-CM | POA: Diagnosis not present

## 2021-05-10 LAB — BASIC METABOLIC PANEL
Anion gap: 5 (ref 5–15)
BUN: 14 mg/dL (ref 8–23)
CO2: 27 mmol/L (ref 22–32)
Calcium: 8.4 mg/dL — ABNORMAL LOW (ref 8.9–10.3)
Chloride: 106 mmol/L (ref 98–111)
Creatinine, Ser: 1.04 mg/dL (ref 0.61–1.24)
GFR, Estimated: >60 mL/min (ref >60)
Glucose, Bld: 131 mg/dL — ABNORMAL HIGH (ref 70–99)
Potassium: 3.7 mmol/L (ref 3.5–5.1)
Sodium: 138 mmol/L (ref 135–145)

## 2021-05-10 LAB — HEMOGLOBIN AND HEMATOCRIT, BLOOD
HCT: 38 % — ABNORMAL LOW (ref 39.0–52.0)
Hemoglobin: 12.2 g/dL — ABNORMAL LOW (ref 13.0–17.0)

## 2021-05-10 NOTE — Progress Notes (Signed)
Looks good ?Labs  normal ?Incisions good ?Postop explained ?

## 2021-05-10 NOTE — Discharge Summary (Signed)
Date of admission: 05/09/2021 ? ?Date of discharge: 05/10/2021 ? ?Admission diagnosis: stress incontinence ? ?Discharge diagnosis: stress incontinence ? ?Secondary diagnoses: Cancer of prostate ? ?History and Physical: For full details, please see admission history and physical. Briefly, Robert Miller is a 81 y.o. year old patient with the above diagnosis.  ? ?Hospital Course: Implantation of arificial sphincter with good post op course ? ?Laboratory values:  ?Recent Labs  ?  05/09/21 ?1541 05/10/21 ?0327  ?HGB 12.5* 12.2*  ?HCT 38.8* 38.0*  ? ?Recent Labs  ?  05/10/21 ?0327  ?CREATININE 1.04  ? ? ?Disposition: Home ? ?Discharge instruction: The patient was instructed to be ambulatory but told to refrain from heavy lifting, strenuous activity, or driving. Detaile ? ?Discharge medications:  ?Allergies as of 05/10/2021   ? ?   Reactions  ? Sudafed [pseudoephedrine Hcl] Other (See Comments)  ? Urinary retention   ? Afrin [oxymetazoline]   ? Needed to use it too much  ? Doxycycline   ? Stomach upset  ? ?  ? ?  ?Medication List  ?  ? ?STOP taking these medications   ? ?nitrofurantoin 100 MG capsule ?Commonly known as: MACRODANTIN ?  ? ?  ? ?TAKE these medications   ? ?aspirin EC 81 MG tablet ?Take 81 mg by mouth daily. ?  ?atorvastatin 10 MG tablet ?Commonly known as: LIPITOR ?Take 10 mg by mouth every morning. ?  ?HYDROcodone-acetaminophen 5-325 MG tablet ?Commonly known as: Norco ?Take 1 tablet by mouth every 4 (four) hours as needed for moderate pain. ?  ?losartan 100 MG tablet ?Commonly known as: COZAAR ?Take 100 mg by mouth daily. ?  ?oxybutynin 5 MG tablet ?Commonly known as: DITROPAN ?Take 1 tablet (5 mg total) by mouth every 8 (eight) hours as needed for bladder spasms. ?  ?POTASSIUM CITRATE PO ?Take 1 tablet by mouth daily. ?  ?tamsulosin 0.4 MG Caps capsule ?Commonly known as: FLOMAX ?Take 0.4 mg by mouth every morning. ?  ?vitamin B-12 500 MCG tablet ?Commonly known as: CYANOCOBALAMIN ?Take 500 mcg by mouth  daily. ?  ?VITAMIN C PO ?Take 1 tablet by mouth daily. ?  ? ?  ? ? ?Followup:  ? Follow-up Information   ? ? Bjorn Loser, MD Follow up.   ?Specialty: Urology ?Why: office will call you with date and time of appt. ?Contact information: ?[address] ?[place] [address] ?[phone] ? ? ?  ?  ? ?  ?  ? ?  ?  ?

## 2021-05-10 NOTE — Plan of Care (Signed)
?  Problem: Education: Goal: Knowledge of General Education information will improve Description: Including pain rating scale, medication(s)/side effects and non-pharmacologic comfort measures Outcome: Progressing   Problem: Clinical Measurements: Goal: Respiratory complications will improve Outcome: Progressing   Problem: Activity: Goal: Risk for activity intolerance will decrease Outcome: Progressing   Problem: Pain Managment: Goal: General experience of comfort will improve Outcome: Progressing   

## 2021-06-01 DIAGNOSIS — N390 Urinary tract infection, site not specified: Secondary | ICD-10-CM | POA: Diagnosis not present

## 2021-06-28 DIAGNOSIS — N210 Calculus in bladder: Secondary | ICD-10-CM | POA: Diagnosis not present

## 2021-07-10 DIAGNOSIS — I129 Hypertensive chronic kidney disease with stage 1 through stage 4 chronic kidney disease, or unspecified chronic kidney disease: Secondary | ICD-10-CM | POA: Diagnosis not present

## 2021-07-10 DIAGNOSIS — D696 Thrombocytopenia, unspecified: Secondary | ICD-10-CM | POA: Diagnosis not present

## 2021-07-10 DIAGNOSIS — N183 Chronic kidney disease, stage 3 unspecified: Secondary | ICD-10-CM | POA: Diagnosis not present

## 2021-07-10 DIAGNOSIS — I700 Atherosclerosis of aorta: Secondary | ICD-10-CM | POA: Diagnosis not present

## 2021-07-25 DIAGNOSIS — Z0000 Encounter for general adult medical examination without abnormal findings: Secondary | ICD-10-CM | POA: Diagnosis not present

## 2021-08-22 DIAGNOSIS — N210 Calculus in bladder: Secondary | ICD-10-CM | POA: Diagnosis not present

## 2021-10-31 DIAGNOSIS — Z961 Presence of intraocular lens: Secondary | ICD-10-CM | POA: Diagnosis not present

## 2021-11-08 ENCOUNTER — Ambulatory Visit: Payer: Self-pay

## 2021-11-08 NOTE — Patient Outreach (Signed)
  Care Coordination   11/08/2021 Name: Robert Miller MRN: [id number] DOB: May 16, 1940   Care Coordination Outreach Attempts:  An unsuccessful telephone outreach was attempted today to offer the patient information about available care coordination services as a benefit of their health plan.   Follow Up Plan:  Additional outreach attempts will be made to offer the patient care coordination information and services.   Encounter Outcome:  No Answer  Care Coordination Interventions Activated:  No   Care Coordination Interventions:  No, not indicated     [facility] [phone]

## 2022-02-19 DIAGNOSIS — N210 Calculus in bladder: Secondary | ICD-10-CM | POA: Diagnosis not present

## 2022-07-17 DIAGNOSIS — I700 Atherosclerosis of aorta: Secondary | ICD-10-CM | POA: Diagnosis not present

## 2022-07-17 DIAGNOSIS — I129 Hypertensive chronic kidney disease with stage 1 through stage 4 chronic kidney disease, or unspecified chronic kidney disease: Secondary | ICD-10-CM | POA: Diagnosis not present

## 2022-07-17 DIAGNOSIS — N1831 Chronic kidney disease, stage 3a: Secondary | ICD-10-CM | POA: Diagnosis not present

## 2022-07-17 DIAGNOSIS — Z8679 Personal history of other diseases of the circulatory system: Secondary | ICD-10-CM | POA: Diagnosis not present

## 2022-07-27 DIAGNOSIS — Z0000 Encounter for general adult medical examination without abnormal findings: Secondary | ICD-10-CM | POA: Diagnosis not present

## 2022-11-09 DIAGNOSIS — H43813 Vitreous degeneration, bilateral: Secondary | ICD-10-CM | POA: Diagnosis not present

## 2022-11-09 DIAGNOSIS — Z961 Presence of intraocular lens: Secondary | ICD-10-CM | POA: Diagnosis not present

## 2022-11-09 DIAGNOSIS — H5203 Hypermetropia, bilateral: Secondary | ICD-10-CM | POA: Diagnosis not present

## 2022-11-09 DIAGNOSIS — H52203 Unspecified astigmatism, bilateral: Secondary | ICD-10-CM | POA: Diagnosis not present

## 2022-11-09 DIAGNOSIS — H524 Presbyopia: Secondary | ICD-10-CM | POA: Diagnosis not present

## 2022-11-09 DIAGNOSIS — H26493 Other secondary cataract, bilateral: Secondary | ICD-10-CM | POA: Diagnosis not present

## 2023-03-01 DIAGNOSIS — N210 Calculus in bladder: Secondary | ICD-10-CM | POA: Diagnosis not present

## 2023-07-17 ENCOUNTER — Other Ambulatory Visit: Payer: Self-pay | Admitting: Family Medicine

## 2023-07-17 DIAGNOSIS — I129 Hypertensive chronic kidney disease with stage 1 through stage 4 chronic kidney disease, or unspecified chronic kidney disease: Secondary | ICD-10-CM | POA: Diagnosis not present

## 2023-07-17 DIAGNOSIS — M79605 Pain in left leg: Secondary | ICD-10-CM | POA: Diagnosis not present

## 2023-07-17 DIAGNOSIS — I2510 Atherosclerotic heart disease of native coronary artery without angina pectoris: Secondary | ICD-10-CM | POA: Diagnosis not present

## 2023-07-17 DIAGNOSIS — E7800 Pure hypercholesterolemia, unspecified: Secondary | ICD-10-CM | POA: Diagnosis not present

## 2023-07-17 DIAGNOSIS — N1831 Chronic kidney disease, stage 3a: Secondary | ICD-10-CM | POA: Diagnosis not present

## 2023-07-18 ENCOUNTER — Ambulatory Visit
Admission: RE | Admit: 2023-07-18 | Discharge: 2023-07-18 | Disposition: A | Discharge status: Home / Self Care | Source: Ambulatory Visit | Attending: Family Medicine | Admitting: Family Medicine

## 2023-07-18 DIAGNOSIS — I82812 Embolism and thrombosis of superficial veins of left lower extremities: Secondary | ICD-10-CM | POA: Diagnosis not present

## 2023-07-18 DIAGNOSIS — M79605 Pain in left leg: Secondary | ICD-10-CM

## 2023-08-08 DIAGNOSIS — Z23 Encounter for immunization: Secondary | ICD-10-CM | POA: Diagnosis not present

## 2023-08-08 DIAGNOSIS — Z0000 Encounter for general adult medical examination without abnormal findings: Secondary | ICD-10-CM | POA: Diagnosis not present

## 2023-12-04 DIAGNOSIS — H43813 Vitreous degeneration, bilateral: Secondary | ICD-10-CM | POA: Diagnosis not present

## 2023-12-04 DIAGNOSIS — H26491 Other secondary cataract, right eye: Secondary | ICD-10-CM | POA: Diagnosis not present

## 2023-12-04 DIAGNOSIS — H0012 Chalazion right lower eyelid: Secondary | ICD-10-CM | POA: Diagnosis not present

## 2023-12-04 DIAGNOSIS — H0100B Unspecified blepharitis left eye, upper and lower eyelids: Secondary | ICD-10-CM | POA: Diagnosis not present

## 2023-12-04 DIAGNOSIS — H52203 Unspecified astigmatism, bilateral: Secondary | ICD-10-CM | POA: Diagnosis not present

## 2023-12-04 DIAGNOSIS — H0100A Unspecified blepharitis right eye, upper and lower eyelids: Secondary | ICD-10-CM | POA: Diagnosis not present

## 2023-12-04 DIAGNOSIS — H524 Presbyopia: Secondary | ICD-10-CM | POA: Diagnosis not present

## 2023-12-04 DIAGNOSIS — H26492 Other secondary cataract, left eye: Secondary | ICD-10-CM | POA: Diagnosis not present

## 2023-12-16 DIAGNOSIS — H26492 Other secondary cataract, left eye: Secondary | ICD-10-CM | POA: Diagnosis not present
